# Patient Record
Sex: Male | Born: 1945 | Race: Black or African American | Hispanic: No | Marital: Married
Health system: Southern US, Academic
[De-identification: ages and names within clinical notes are randomized; demographics above are authoritative.]

## PROBLEM LIST (undated history)

## (undated) ENCOUNTER — Encounter: Attending: Hematology & Oncology | Primary: Hematology & Oncology

## (undated) ENCOUNTER — Telehealth

## (undated) ENCOUNTER — Encounter

## (undated) ENCOUNTER — Encounter
Attending: Student in an Organized Health Care Education/Training Program | Primary: Student in an Organized Health Care Education/Training Program

## (undated) ENCOUNTER — Telehealth: Attending: Hematology & Oncology | Primary: Hematology & Oncology

## (undated) ENCOUNTER — Ambulatory Visit

## (undated) ENCOUNTER — Telehealth
Attending: Student in an Organized Health Care Education/Training Program | Primary: Student in an Organized Health Care Education/Training Program

## (undated) ENCOUNTER — Ambulatory Visit: Payer: MEDICARE

## (undated) ENCOUNTER — Ambulatory Visit
Payer: MEDICARE | Attending: Student in an Organized Health Care Education/Training Program | Primary: Student in an Organized Health Care Education/Training Program

## (undated) ENCOUNTER — Ambulatory Visit
Payer: MEDICARE | Attending: Pharmacist Clinician (PhC)/ Clinical Pharmacy Specialist | Primary: Pharmacist Clinician (PhC)/ Clinical Pharmacy Specialist

## (undated) ENCOUNTER — Encounter: Attending: Vascular & Interventional Radiology | Primary: Vascular & Interventional Radiology

## (undated) ENCOUNTER — Ambulatory Visit: Payer: BLUE CROSS/BLUE SHIELD

## (undated) ENCOUNTER — Ambulatory Visit
Attending: Student in an Organized Health Care Education/Training Program | Primary: Student in an Organized Health Care Education/Training Program

## (undated) ENCOUNTER — Ambulatory Visit: Payer: MEDICARE | Attending: Hematology & Oncology | Primary: Hematology & Oncology

## (undated) ENCOUNTER — Encounter
Attending: Pharmacist Clinician (PhC)/ Clinical Pharmacy Specialist | Primary: Pharmacist Clinician (PhC)/ Clinical Pharmacy Specialist

## (undated) ENCOUNTER — Inpatient Hospital Stay

## (undated) ENCOUNTER — Encounter: Attending: Family | Primary: Family

## (undated) ENCOUNTER — Encounter: Attending: Oncology | Primary: Oncology

## (undated) DIAGNOSIS — C801 Malignant (primary) neoplasm, unspecified: Secondary | ICD-10-CM

---

## 2011-01-29 ENCOUNTER — Emergency Department: Payer: Self-pay | Admitting: Emergency Medicine

## 2011-11-25 IMAGING — CT CT HEAD WITHOUT CONTRAST
2 series · 16 of 30 positions shown, 20 images · non-contrast
Comparison: none

RESULT:      Technique: Helical 5mm sections were obtained from the skull
base to the vertex without administration of intravenous contrast.

[Series 2: without · axial · non-contrast · 0.50mm/px · z∈[-112,+18]mm · 13 of 32 slices shown, 17 images]
[im 3/32  brain]
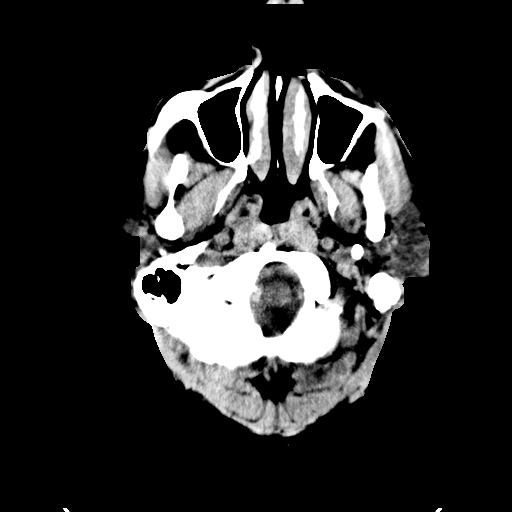
[im 3/32  bone]
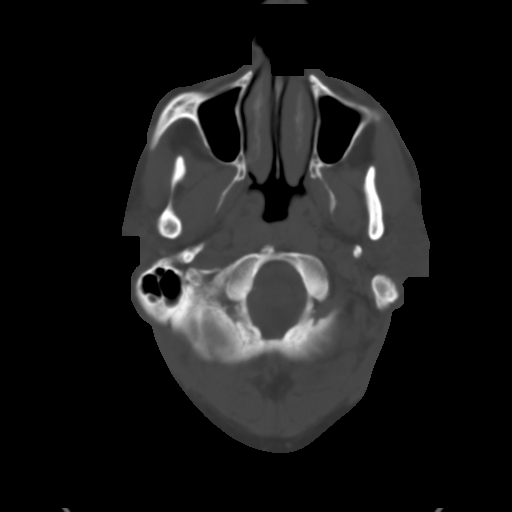
[im 5/32  brain]
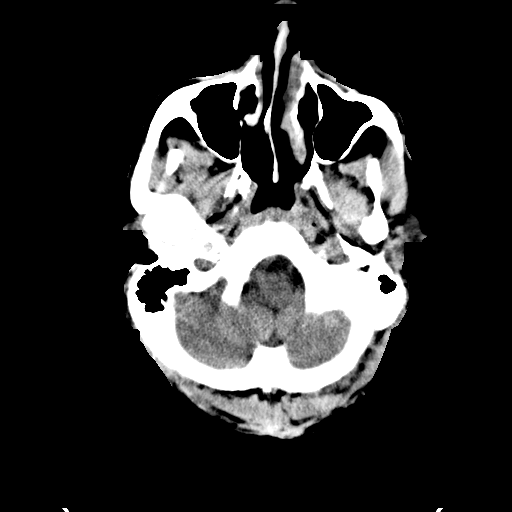
[im 7/32  brain]
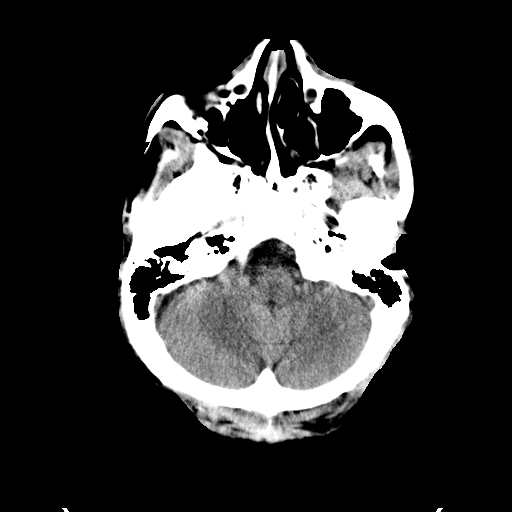
[im 9/32  brain]
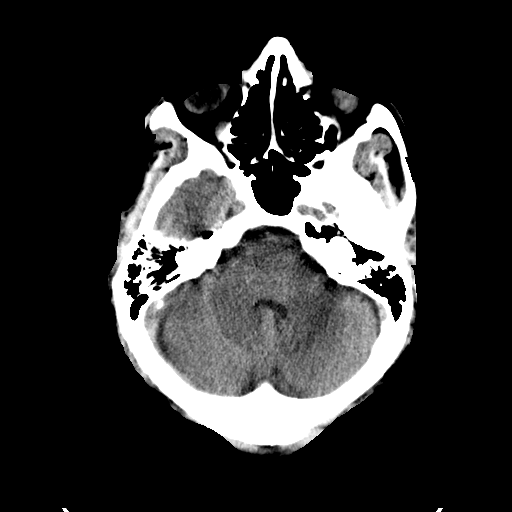
[im 12/32  brain]
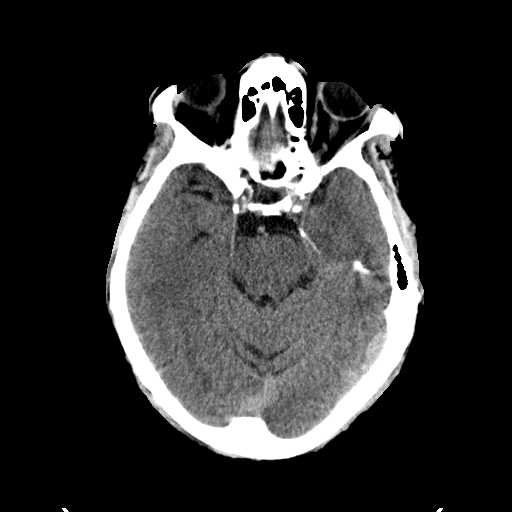
[im 12/32  bone]
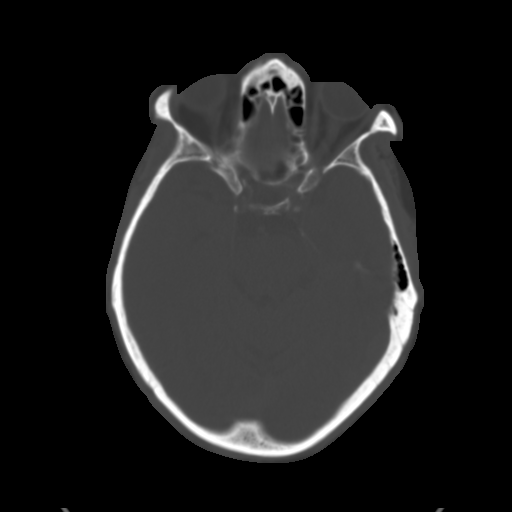
[im 14/32  brain]
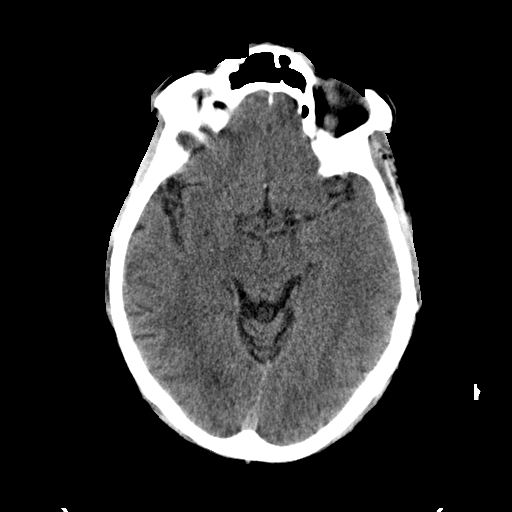
[im 16/32  brain]
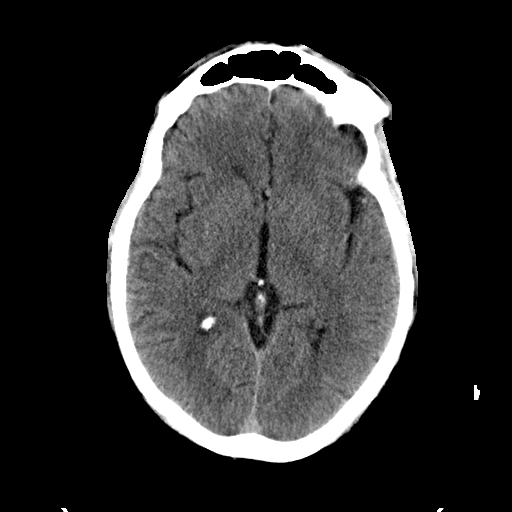
[im 18/32  brain]
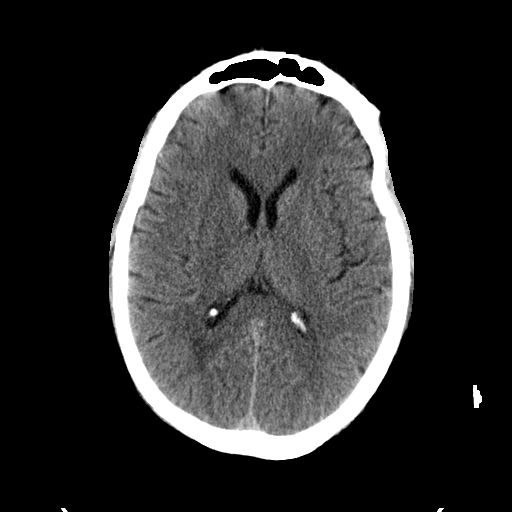
[im 20/32  brain]
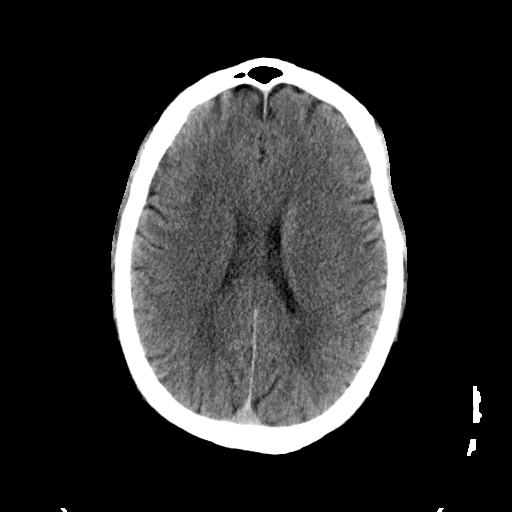
[im 20/32  bone]
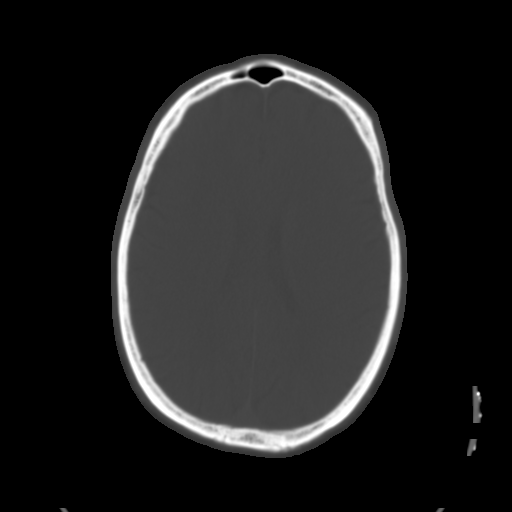
[im 23/32  brain]
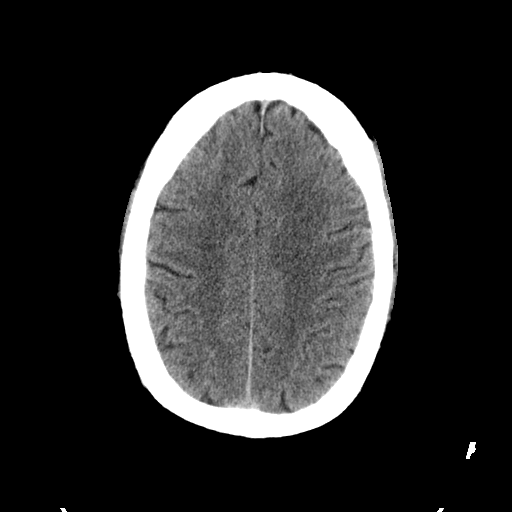
[im 25/32  brain]
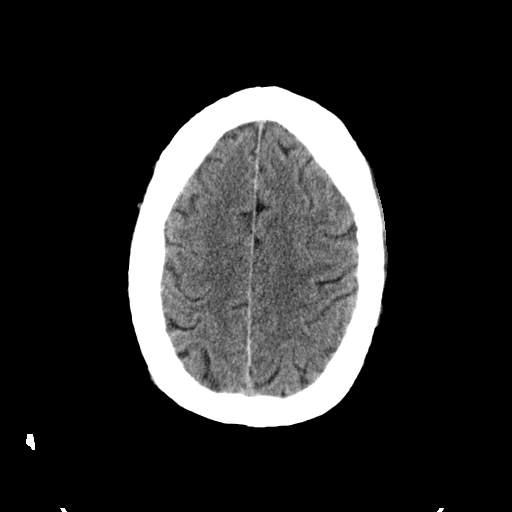
[im 27/32  brain]
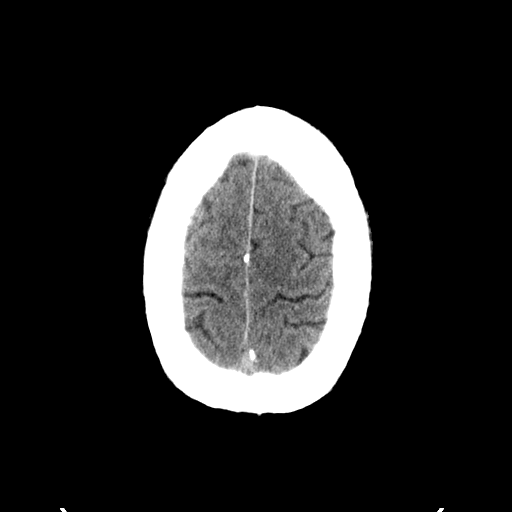
[im 29/32  brain]
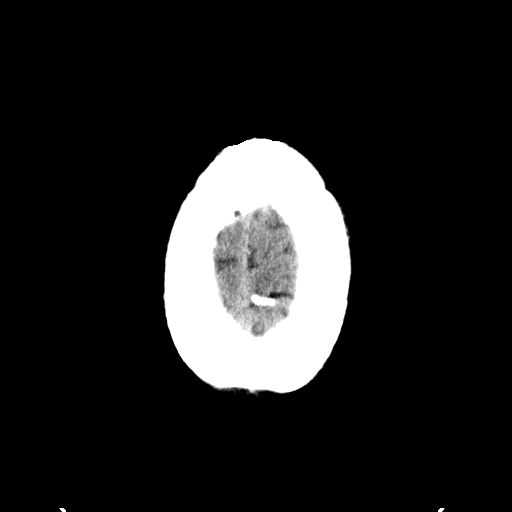
[im 29/32  bone]
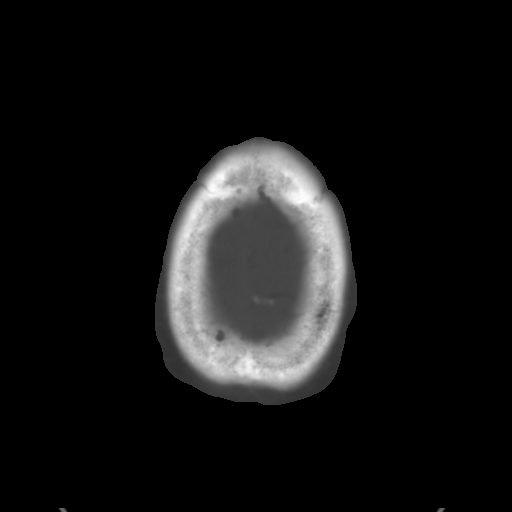

[Series 3: bone · axial · 0.50mm/px · z∈[-112,-66]mm · 3 of 32 slices shown]
[im 3/32  bone]
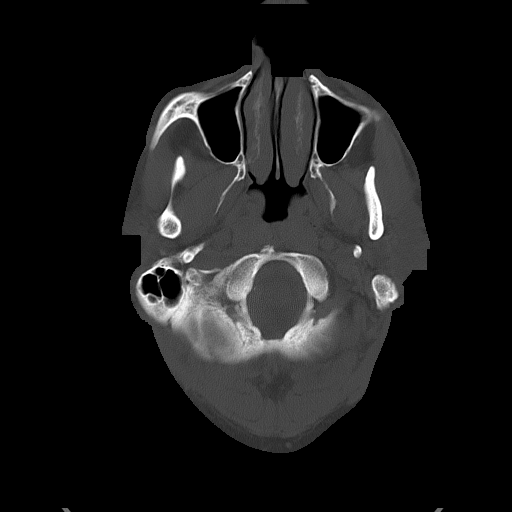
[im 7/32  bone]
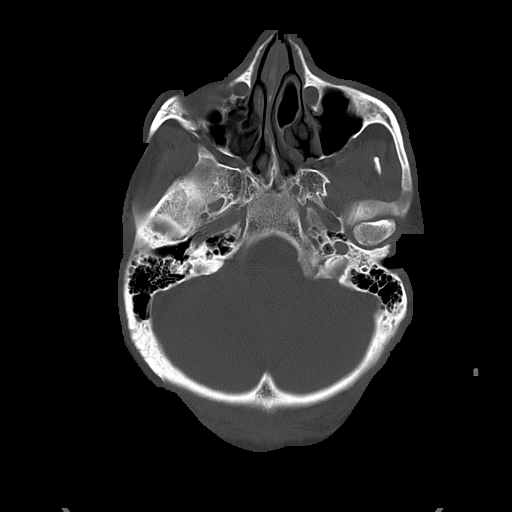
[im 12/32  bone]
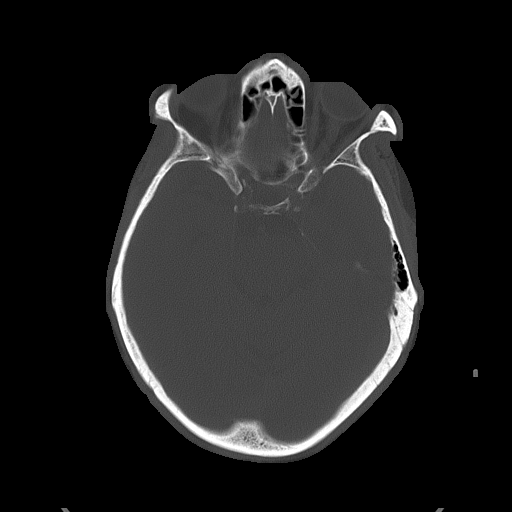

[16 of 30 positions shown; findings below may reference images not displayed]

FINDINGS: There is not evidence of intra-axial fluid collections. There is
no evidence of acute hemorrhage or secondary signs reflecting mass effect or
subacute or chronic focal territorial infarction. The osseous structures
demonstrate no evidence of a depressed skull fracture. If there is
persistent concern clinical follow-up with MRI is recommended.
IMPRESSION: 1. No evidence of acute intracranial abnormalitites.
2. Dr. Roberto of the emergency department was informed of these findings via a
preliminary faxed report.

## 2017-02-22 ENCOUNTER — Ambulatory Visit: Payer: Medicare Other

## 2017-02-22 ENCOUNTER — Ambulatory Visit
Admission: EM | Admit: 2017-02-22 | Discharge: 2017-02-22 | Disposition: A | Discharge status: Home / Self Care | Payer: Medicare Other | Attending: Family Medicine | Admitting: Family Medicine

## 2017-02-22 DIAGNOSIS — M25441 Effusion, right hand: Secondary | ICD-10-CM | POA: Diagnosis not present

## 2017-02-22 DIAGNOSIS — R2231 Localized swelling, mass and lump, right upper limb: Secondary | ICD-10-CM

## 2017-02-22 DIAGNOSIS — M79641 Pain in right hand: Secondary | ICD-10-CM | POA: Diagnosis present

## 2017-02-22 DIAGNOSIS — M19041 Primary osteoarthritis, right hand: Secondary | ICD-10-CM | POA: Insufficient documentation

## 2017-02-22 DIAGNOSIS — Z87891 Personal history of nicotine dependence: Secondary | ICD-10-CM | POA: Insufficient documentation

## 2017-02-22 DIAGNOSIS — Z79899 Other long term (current) drug therapy: Secondary | ICD-10-CM | POA: Insufficient documentation

## 2017-02-22 DIAGNOSIS — M7989 Other specified soft tissue disorders: Secondary | ICD-10-CM | POA: Diagnosis not present

## 2017-02-22 MED ORDER — MELOXICAM 15 MG PO TABS: 15.0000 mg | ORAL_TABLET | Freq: Every day | ORAL | 0 refills | Status: AC

## 2017-02-22 NOTE — ED Triage Notes (Addendum)
Pt reports 2 days of right hand pain and swelling. No known injury but reports it started when his right 5th finger "locked up."  Pain now radiates into right wrist. Pain level 10/10 when trying to move

## 2017-02-22 NOTE — ED Provider Notes (Signed)
[facility]    CSN: [id number] Arrival date & time: 02/22/17  0940     History   Chief Complaint Chief Complaint  Patient presents with  . Hand Problem    HPI Tracy Galloway is a 71 y.o. male.   Patient is a 71 year old black male who is complaining of pain in his right hand. Apparently she is a 74 felt that he may have slept on his right hand rolled over on. Next day he's had pain in the right hand difficulty making a fist moving the hand. Axis and the swelling has gone down he states from yesterday but still very tender to touch and still very painful hurts when he moves his hand or makes any changes in the hand and the fist of the right hand. He is concerned about the right finger jumping as he puts it most the swelling is more the ulnar side than the radial side of his hand and fingers. Denies any history of surgeries on the right hand or other medical problems like hand previously. He does not smoke. Denies any surgical history no previous medical problems. There is no pertinent family medical history relevant to today's visit either.   The history is provided by the patient. No language interpreter was used.  Hand Pain  This is a new problem. The current episode started 2 days ago. The problem occurs constantly. The problem has been gradually improving. Pertinent negatives include no chest pain, no abdominal pain, no headaches and no shortness of breath. The symptoms are aggravated by exertion. Nothing relieves the symptoms. He has tried acetaminophen for the symptoms. The treatment provided no relief.    History reviewed. No pertinent past medical history.  There are no active problems to display for this patient.   History reviewed. No pertinent surgical history.     Home Medications    Prior to Admission medications   Medication Sig Start Date End Date Taking? Authorizing Provider  meloxicam (MOBIC) 15 MG tablet Take 1 tablet (15 mg total) by mouth daily.  02/22/17   Frederich Cha, MD    Family History History reviewed. No pertinent family history.  Social History Social History  Substance Use Topics  . Smoking status: Former Research scientist (life sciences)  . Smokeless tobacco: Never Used  . Alcohol use No     Allergies   Patient has no known allergies.   Review of Systems Review of Systems  Respiratory: Negative for shortness of breath.   Cardiovascular: Negative for chest pain.  Gastrointestinal: Negative for abdominal pain.  Musculoskeletal: Positive for joint swelling and myalgias.  Neurological: Negative for headaches.  All other systems reviewed and are negative.    Physical Exam Triage Vital Signs ED Triage Vitals  Enc Vitals Group     BP 02/22/17 0957 (!) 174/102     Pulse Rate 02/22/17 0957 100     Resp 02/22/17 0957 20     Temp 02/22/17 0957 97.6 F (36.4 C)     Temp Source 02/22/17 0957 Oral     SpO2 02/22/17 0957 100 %     Weight 02/22/17 0959 250 lb (113.4 kg)     Height 02/22/17 0959 6\' 3"  (1.905 m)     Head Circumference --      Peak Flow --      Pain Score 02/22/17 0959 10     Pain Loc --      Pain Edu? --      Excl. in [place]? --  No data found.   Updated Vital Signs BP (!) 174/102 (BP Location: Left Arm)   Pulse 100   Temp 97.6 F (36.4 C) (Oral)   Resp 20   Ht 6\' 3"  (1.905 m)   Wt 250 lb (113.4 kg)   SpO2 100%   BMI 31.25 kg/m   Visual Acuity Right Eye Distance:   Left Eye Distance:   Bilateral Distance:    Right Eye Near:   Left Eye Near:    Bilateral Near:     Physical Exam  Constitutional: He is oriented to person, place, and time. He appears well-developed and well-nourished.  HENT:  Head: Normocephalic.  Eyes: Pupils are equal, round, and reactive to light.  Neck: Normal range of motion.  Pulmonary/Chest: Effort normal.  Musculoskeletal: He exhibits edema and tenderness.       Right hand: He exhibits decreased range of motion, tenderness, bony tenderness and swelling.       Hands: The  whole right hand is swollen in attempt to figure out exactly if there is any particular bony abnormalities or fractures of any of the bones of the hand patient when hand was touched the meal he withdrew it and screamed out don't touch my hand. At that point no further examination the hand will be attempted and will send him for x-rays right hand  Neurological: He is alert and oriented to person, place, and time. No cranial nerve deficit.  Skin: Skin is warm. He is not diaphoretic. No erythema.  Psychiatric: He has a normal mood and affect.  Vitals reviewed.    UC Treatments / Results  Labs (all labs ordered are listed, but only abnormal results are displayed) Labs Reviewed - No data to display  EKG  EKG Interpretation None       Radiology No results found.  Procedures Procedures (including critical care time)  Medications Ordered in UC Medications - No data to display   Initial Impression / Assessment and Plan / UC Course  I have reviewed the triage vital signs and the nursing notes.  Pertinent labs & imaging results that were available during my care of the patient were reviewed by me and considered in my medical decision making (see chart for details).     Right hand swelling and tenderness more than likely patient stepped on it with now bruising of the right hand and swelling of the right hand if x-rays of the hand is negative will place him on Mobic 15 mg 1 tablet a day and follow-up with his PCP or orthopedist of his choice not better in 1-2 weeks. But at this point time no further examination of the hand will be done per his request  Final Clinical Impressions(s) / UC Diagnoses   Final diagnoses:  Right hand pain  Swelling of right hand    New Prescriptions New Prescriptions   MELOXICAM (MOBIC) 15 MG TABLET    Take 1 tablet (15 mg total) by mouth daily.  X-ray of the right hand just showed arthritis present.   Note: This dictation was prepared with Dragon  dictation along with smaller phrase technology. Any transcriptional errors that result from this process are unintentional.   Frederich Cha, MD 02/22/17 1113

## 2017-12-19 IMAGING — CR DG HAND COMPLETE 3+V*R*
3 series · 4 of 4 positions shown · non-contrast
Comparison: None in PACs

CLINICAL DATA: Two days of fifth finger pain radiating into the
medial aspect of the hand. Occasional locking of the fifth finger.
Touch tender.

EXAM:
RIGHT HAND - COMPLETE 3+ VIEW

[hand ap]
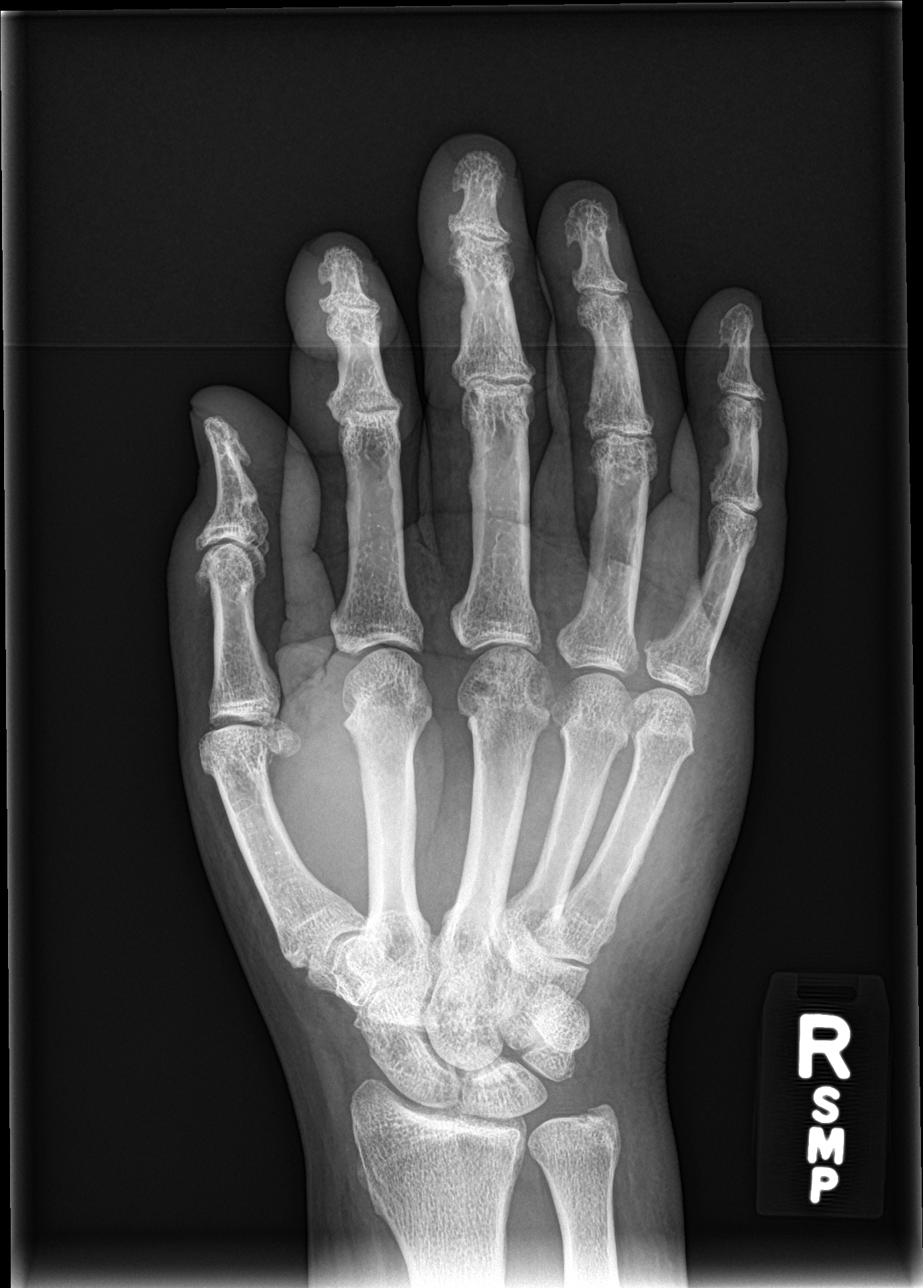

[hand obl]
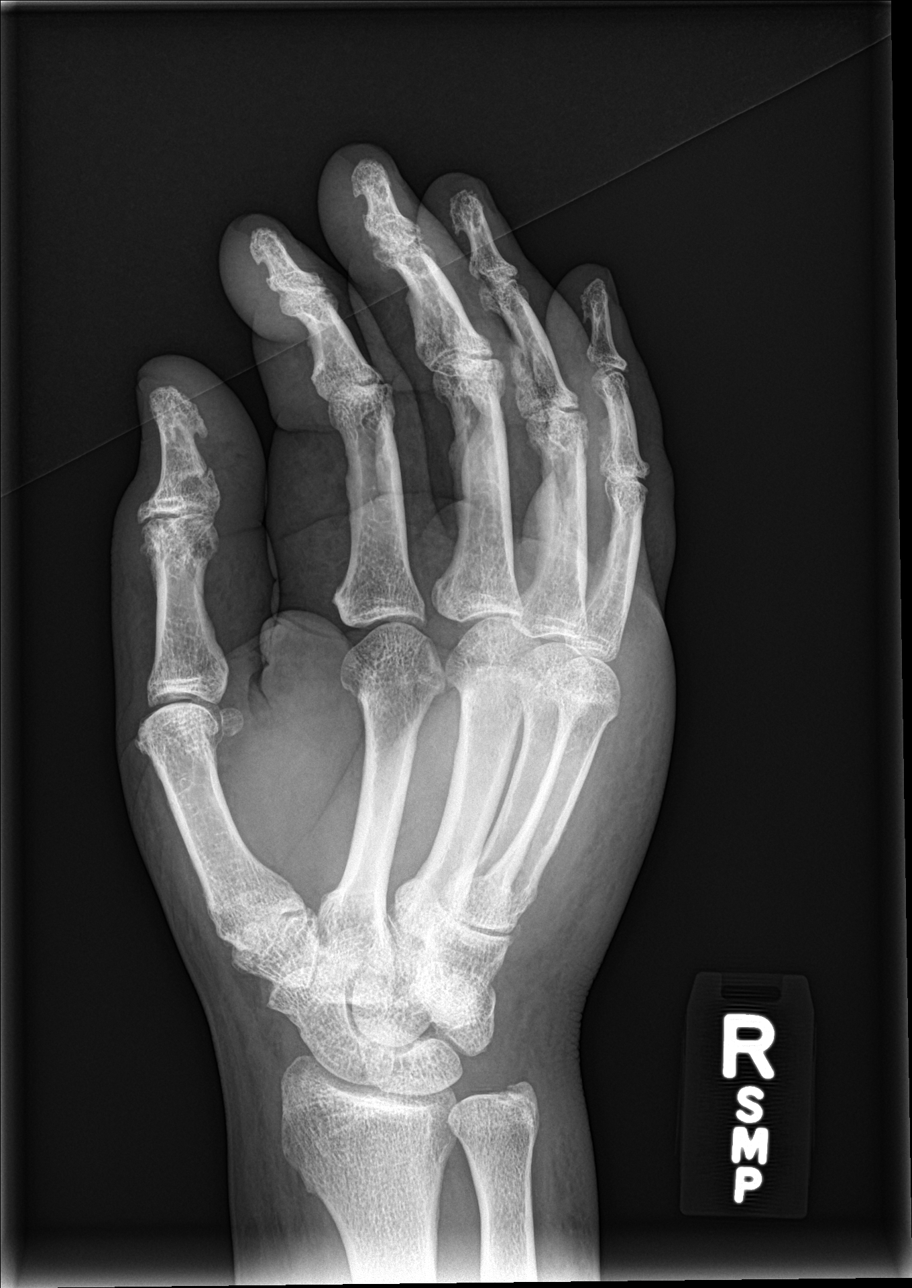

[Series 3: hand lat · 0.14mm/px · 2 of 2 slices shown]
[im 1/2]
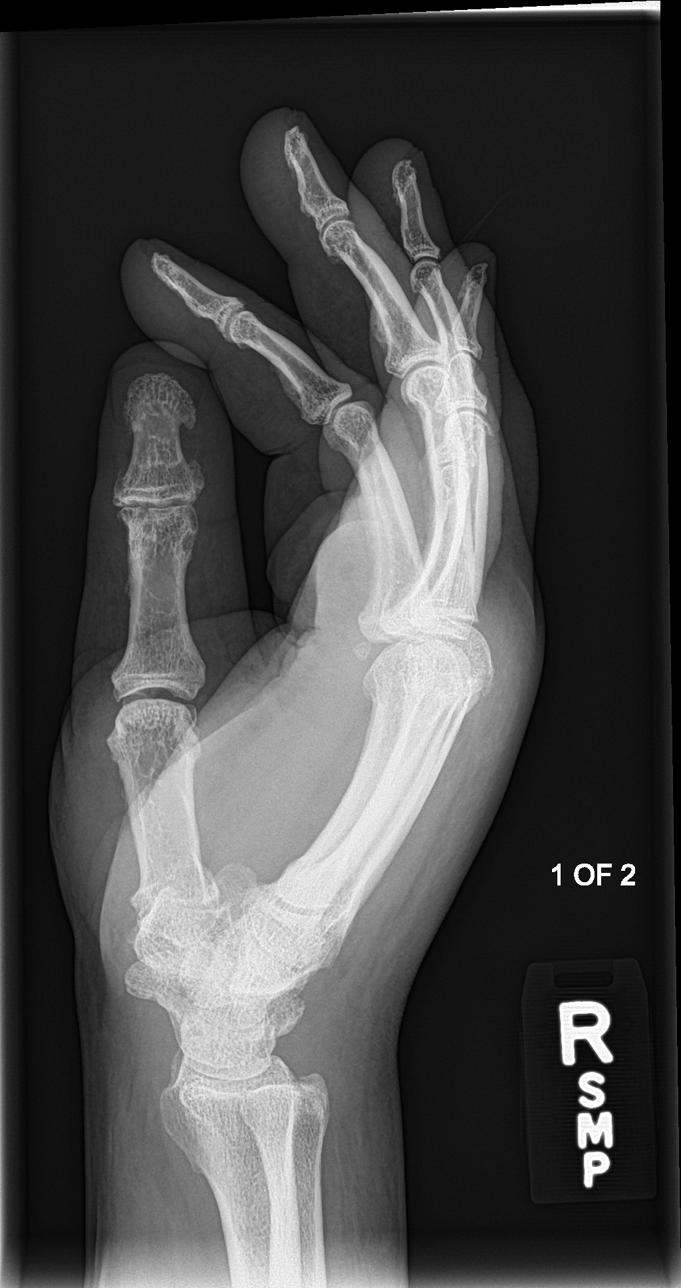
[im 2/2]
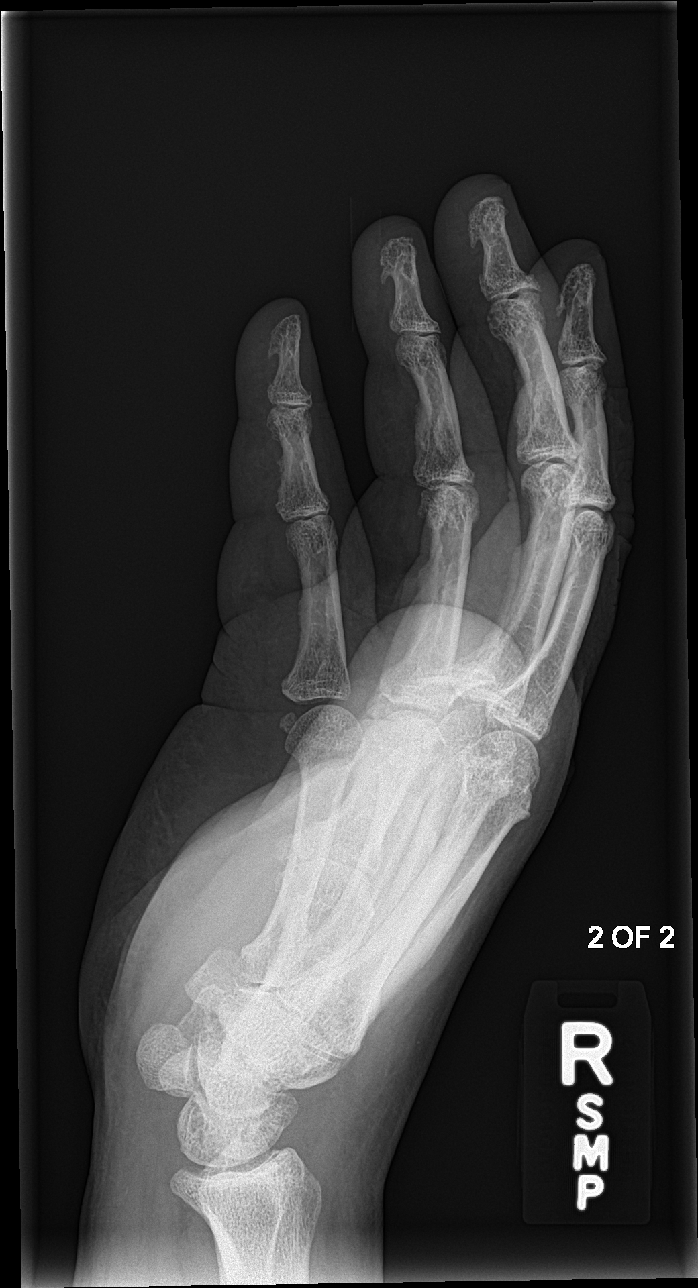

[4 of 4 positions shown; findings below may reference images not displayed]

FINDINGS: The bones are subjectively adequately mineralized. There is mild
degenerative change of the interphalangeal joints diffusely. There
is mild narrowing of the first, second, and third MCP joints. No
erosive changes are observed. There is no acute fracture or
dislocation. There is no lytic nor blastic lesion. The carpal bones
exhibit no acute abnormalities.
IMPRESSION: Mild osteoarthritic change of the interphalangeal and MCP joints as
described. No acute abnormality of the fifth finger is observed.

## 2018-10-15 ENCOUNTER — Ambulatory Visit: Admit: 2018-10-15 | Discharge: 2018-10-17 | Disposition: A | Discharge status: Home Health | Payer: MEDICARE

## 2018-10-15 DIAGNOSIS — Z87891 Personal history of nicotine dependence: Principal | ICD-10-CM

## 2018-10-15 DIAGNOSIS — G9389 Other specified disorders of brain: Principal | ICD-10-CM

## 2018-10-15 DIAGNOSIS — I10 Essential (primary) hypertension: Principal | ICD-10-CM

## 2018-10-15 DIAGNOSIS — R42 Dizziness and giddiness: Principal | ICD-10-CM

## 2018-10-15 DIAGNOSIS — I161 Hypertensive emergency: Principal | ICD-10-CM

## 2018-10-15 DIAGNOSIS — C7951 Secondary malignant neoplasm of bone: Principal | ICD-10-CM

## 2018-10-16 DIAGNOSIS — I10 Essential (primary) hypertension: Principal | ICD-10-CM

## 2018-10-16 DIAGNOSIS — G9389 Other specified disorders of brain: Principal | ICD-10-CM

## 2018-10-16 DIAGNOSIS — I161 Hypertensive emergency: Principal | ICD-10-CM

## 2018-10-16 DIAGNOSIS — R42 Dizziness and giddiness: Principal | ICD-10-CM

## 2018-10-16 DIAGNOSIS — Z87891 Personal history of nicotine dependence: Principal | ICD-10-CM

## 2018-10-16 DIAGNOSIS — C7951 Secondary malignant neoplasm of bone: Principal | ICD-10-CM

## 2018-10-16 MED ORDER — ONDANSETRON HCL 4 MG TABLET
ORAL_TABLET | Freq: Three times a day (TID) | ORAL | 0 refills | 0 days | Status: CP | PRN
Start: 2018-10-16 — End: 2018-10-17

## 2018-10-16 MED ORDER — LISINOPRIL 40 MG TABLET: 40 mg | tablet | Freq: Every day | 0 refills | 0 days | Status: AC

## 2018-10-17 DIAGNOSIS — R42 Dizziness and giddiness: Principal | ICD-10-CM

## 2018-10-17 DIAGNOSIS — I161 Hypertensive emergency: Principal | ICD-10-CM

## 2018-10-17 DIAGNOSIS — C7951 Secondary malignant neoplasm of bone: Principal | ICD-10-CM

## 2018-10-17 DIAGNOSIS — G9389 Other specified disorders of brain: Principal | ICD-10-CM

## 2018-10-17 DIAGNOSIS — Z87891 Personal history of nicotine dependence: Principal | ICD-10-CM

## 2018-10-17 DIAGNOSIS — I10 Essential (primary) hypertension: Principal | ICD-10-CM

## 2018-10-17 MED ORDER — LISINOPRIL 40 MG TABLET
ORAL_TABLET | Freq: Every day | ORAL | 0 refills | 0.00000 days | Status: CP
Start: 2018-10-17 — End: 2018-10-17
  Filled 2018-10-17: qty 30, 30d supply, fill #0

## 2018-10-17 MED ORDER — LISINOPRIL 40 MG TABLET: 40 mg | tablet | Freq: Every day | 0 refills | 0 days | Status: AC

## 2018-10-17 MED FILL — LISINOPRIL 40 MG TABLET: 30 days supply | Qty: 30 | Fill #0 | Status: AC

## 2018-10-21 ENCOUNTER — Institutional Professional Consult (permissible substitution): Admit: 2018-10-21 | Discharge: 2018-10-22 | Payer: MEDICARE

## 2018-10-21 DIAGNOSIS — G9389 Other specified disorders of brain: Principal | ICD-10-CM

## 2018-10-23 ENCOUNTER — Ambulatory Visit
Admit: 2018-10-23 | Discharge: 2018-10-24 | Payer: MEDICARE | Attending: Hematology & Oncology | Primary: Hematology & Oncology

## 2018-10-23 DIAGNOSIS — C61 Malignant neoplasm of prostate: Principal | ICD-10-CM

## 2018-10-23 DIAGNOSIS — C7951 Secondary malignant neoplasm of bone: Principal | ICD-10-CM

## 2018-10-23 DIAGNOSIS — I10 Essential (primary) hypertension: Principal | ICD-10-CM

## 2018-10-23 MED ORDER — APALUTAMIDE 60 MG TABLET
ORAL_TABLET | Freq: Every day | ORAL | 11 refills | 0 days | Status: CP
Start: 2018-10-23 — End: 2019-01-02

## 2018-10-25 ENCOUNTER — Ambulatory Visit: Admit: 2018-10-25 | Discharge: 2018-10-26 | Payer: MEDICARE

## 2018-10-25 DIAGNOSIS — I10 Essential (primary) hypertension: Principal | ICD-10-CM

## 2018-10-25 DIAGNOSIS — C61 Malignant neoplasm of prostate: Principal | ICD-10-CM

## 2018-10-25 DIAGNOSIS — C801 Malignant (primary) neoplasm, unspecified: Principal | ICD-10-CM

## 2018-10-25 DIAGNOSIS — R739 Hyperglycemia, unspecified: Principal | ICD-10-CM

## 2018-10-25 DIAGNOSIS — D638 Anemia in other chronic diseases classified elsewhere: Principal | ICD-10-CM

## 2018-10-25 DIAGNOSIS — I161 Hypertensive emergency: Principal | ICD-10-CM

## 2018-10-25 DIAGNOSIS — Z09 Encounter for follow-up examination after completed treatment for conditions other than malignant neoplasm: Principal | ICD-10-CM

## 2018-10-28 ENCOUNTER — Ambulatory Visit: Admit: 2018-10-28 | Discharge: 2018-11-10 | Payer: MEDICARE

## 2018-10-28 DIAGNOSIS — C61 Malignant neoplasm of prostate: Principal | ICD-10-CM

## 2018-10-28 DIAGNOSIS — R937 Abnormal findings on diagnostic imaging of other parts of musculoskeletal system: Principal | ICD-10-CM

## 2018-10-30 ENCOUNTER — Ambulatory Visit
Admit: 2018-10-30 | Discharge: 2018-10-30 | Payer: MEDICARE | Attending: Hematology & Oncology | Primary: Hematology & Oncology

## 2018-10-30 ENCOUNTER — Institutional Professional Consult (permissible substitution): Admit: 2018-10-30 | Discharge: 2018-10-30 | Payer: MEDICARE

## 2018-10-30 ENCOUNTER — Ambulatory Visit: Admit: 2018-10-30 | Discharge: 2018-10-30 | Payer: MEDICARE

## 2018-10-30 DIAGNOSIS — C61 Malignant neoplasm of prostate: Principal | ICD-10-CM

## 2018-10-30 DIAGNOSIS — C7951 Secondary malignant neoplasm of bone: Secondary | ICD-10-CM

## 2018-10-30 MED ORDER — UREA 10 % TOPICAL CREAM
Freq: Three times a day (TID) | TOPICAL | 11 refills | 0.00000 days | Status: CP
Start: 2018-10-30 — End: 2019-02-05

## 2018-10-30 MED ORDER — TRAMADOL 50 MG TABLET
ORAL_TABLET | 0 refills | 0 days | Status: CP
Start: 2018-10-30 — End: 2018-12-02
  Filled 2018-10-30: qty 60, 5d supply, fill #0

## 2018-10-30 MED ORDER — CELECOXIB 200 MG CAPSULE
ORAL_CAPSULE | Freq: Two times a day (BID) | ORAL | 11 refills | 0 days | Status: CP | PRN
Start: 2018-10-30 — End: 2019-02-11
  Filled 2018-10-30: qty 28, 14d supply, fill #0

## 2018-10-30 MED ORDER — MELOXICAM 7.5 MG TABLET
ORAL_TABLET | Freq: Two times a day (BID) | ORAL | 2 refills | 0.00000 days | Status: SS | PRN
Start: 2018-10-30 — End: 2018-10-30

## 2018-10-30 MED FILL — CELEBREX 200 MG CAPSULE: 14 days supply | Qty: 28 | Fill #0 | Status: AC

## 2018-10-30 MED FILL — TRAMADOL 50 MG TABLET: 5 days supply | Qty: 60 | Fill #0 | Status: AC

## 2018-12-02 ENCOUNTER — Ambulatory Visit
Admit: 2018-12-02 | Discharge: 2018-12-03 | Payer: MEDICARE | Attending: Hematology & Oncology | Primary: Hematology & Oncology

## 2018-12-02 DIAGNOSIS — C7951 Secondary malignant neoplasm of bone: Secondary | ICD-10-CM

## 2018-12-02 DIAGNOSIS — R799 Abnormal finding of blood chemistry, unspecified: Secondary | ICD-10-CM

## 2018-12-02 DIAGNOSIS — C61 Malignant neoplasm of prostate: Principal | ICD-10-CM

## 2018-12-02 DIAGNOSIS — E032 Hypothyroidism due to medicaments and other exogenous substances: Secondary | ICD-10-CM

## 2018-12-02 MED ORDER — CALCIUM CARBONATE 500 MG(1,250 MG)-VITAMIN D3 400 UNIT CHEWABLE TABLET: 1 | tablet | Freq: Every day | 11 refills | 0 days | Status: SS

## 2018-12-02 MED ORDER — CALCIUM CARBONATE 500 MG(1,250 MG)-VITAMIN D3 400 UNIT CHEWABLE TABLET
ORAL_TABLET | Freq: Every day | ORAL | 11 refills | 0.00000 days | Status: SS
Start: 2018-12-02 — End: 2019-02-03

## 2018-12-02 MED ORDER — TRAMADOL 50 MG TABLET
ORAL_TABLET | 0 refills | 0 days | Status: CP
Start: 2018-12-02 — End: 2019-02-05
  Filled 2018-12-11: qty 60, 8d supply, fill #0

## 2018-12-11 MED FILL — CELEBREX 200 MG CAPSULE: 30 days supply | Qty: 60 | Fill #1 | Status: AC

## 2018-12-11 MED FILL — TRAMADOL 50 MG TABLET: 8 days supply | Qty: 60 | Fill #0 | Status: AC

## 2018-12-11 MED FILL — CELEBREX 200 MG CAPSULE: ORAL | 30 days supply | Qty: 60 | Fill #1

## 2019-01-02 ENCOUNTER — Institutional Professional Consult (permissible substitution): Admit: 2019-01-02 | Discharge: 2019-01-02 | Payer: MEDICARE

## 2019-01-02 ENCOUNTER — Ambulatory Visit: Admit: 2019-01-02 | Discharge: 2019-01-02 | Payer: MEDICARE

## 2019-01-02 ENCOUNTER — Ambulatory Visit
Admit: 2019-01-02 | Discharge: 2019-01-02 | Payer: MEDICARE | Attending: Hematology & Oncology | Primary: Hematology & Oncology

## 2019-01-02 DIAGNOSIS — C7951 Secondary malignant neoplasm of bone: Secondary | ICD-10-CM

## 2019-01-02 DIAGNOSIS — R799 Abnormal finding of blood chemistry, unspecified: Secondary | ICD-10-CM

## 2019-01-02 DIAGNOSIS — C61 Malignant neoplasm of prostate: Principal | ICD-10-CM

## 2019-01-02 DIAGNOSIS — E032 Hypothyroidism due to medicaments and other exogenous substances: Secondary | ICD-10-CM

## 2019-01-02 MED ORDER — LISINOPRIL 20 MG TABLET: 20 mg | tablet | Freq: Every day | 11 refills | 0 days | Status: AC

## 2019-01-02 MED ORDER — APALUTAMIDE 60 MG TABLET
ORAL_TABLET | Freq: Every day | ORAL | 11 refills | 0.00000 days | Status: CP
Start: 2019-01-02 — End: 2019-02-03

## 2019-01-02 MED ORDER — LISINOPRIL 20 MG TABLET
ORAL_TABLET | Freq: Every day | ORAL | 11 refills | 0.00000 days | Status: CP
Start: 2019-01-02 — End: 2019-02-03

## 2019-01-06 ENCOUNTER — Ambulatory Visit: Admit: 2019-01-06 | Discharge: 2019-01-07 | Payer: MEDICARE

## 2019-01-06 DIAGNOSIS — R7989 Other specified abnormal findings of blood chemistry: Secondary | ICD-10-CM

## 2019-01-06 DIAGNOSIS — I10 Essential (primary) hypertension: Principal | ICD-10-CM

## 2019-01-06 DIAGNOSIS — C61 Malignant neoplasm of prostate: Secondary | ICD-10-CM

## 2019-01-06 DIAGNOSIS — E785 Hyperlipidemia, unspecified: Secondary | ICD-10-CM

## 2019-01-31 ENCOUNTER — Ambulatory Visit
Admit: 2019-01-31 | Discharge: 2019-02-03 | Disposition: A | Discharge status: Home / Self Care | Payer: MEDICARE | Source: Ambulatory Visit | Admitting: Internal Medicine

## 2019-01-31 ENCOUNTER — Ambulatory Visit
Admission: EM | Admit: 2019-01-31 | Discharge: 2019-01-31 | Disposition: A | Discharge status: Other Acute Inpatient Hospital | Payer: Medicare Other | Attending: Family Medicine | Admitting: Family Medicine

## 2019-01-31 ENCOUNTER — Other Ambulatory Visit: Payer: Self-pay

## 2019-01-31 ENCOUNTER — Encounter: Payer: Self-pay | Admitting: Emergency Medicine

## 2019-01-31 DIAGNOSIS — R112 Nausea with vomiting, unspecified: Secondary | ICD-10-CM | POA: Insufficient documentation

## 2019-01-31 DIAGNOSIS — E860 Dehydration: Secondary | ICD-10-CM | POA: Diagnosis present

## 2019-01-31 DIAGNOSIS — R197 Diarrhea, unspecified: Secondary | ICD-10-CM

## 2019-01-31 DIAGNOSIS — E875 Hyperkalemia: Secondary | ICD-10-CM | POA: Insufficient documentation

## 2019-01-31 DIAGNOSIS — I959 Hypotension, unspecified: Secondary | ICD-10-CM | POA: Insufficient documentation

## 2019-01-31 DIAGNOSIS — N189 Chronic kidney disease, unspecified: Secondary | ICD-10-CM | POA: Diagnosis present

## 2019-01-31 DIAGNOSIS — N179 Acute kidney failure, unspecified: Secondary | ICD-10-CM

## 2019-01-31 HISTORY — DX: Malignant (primary) neoplasm, unspecified: C80.1

## 2019-01-31 LAB — COMPREHENSIVE METABOLIC PANEL
ALT: 13 U/L (ref 0–44)
AST: 20 U/L (ref 15–41)
Albumin: 4 g/dL (ref 3.5–5.0)
Alkaline Phosphatase: 117 U/L (ref 38–126)
Anion gap: 12 (ref 5–15)
BUN: 54 mg/dL — ABNORMAL HIGH (ref 8–23)
CO2: 19 mmol/L — ABNORMAL LOW (ref 22–32)
Calcium: 7.3 mg/dL — ABNORMAL LOW (ref 8.9–10.3)
Chloride: 105 mmol/L (ref 98–111)
Creatinine, Ser: 2.54 mg/dL — ABNORMAL HIGH (ref 0.61–1.24)
GFR calc Af Amer: 29 mL/min — ABNORMAL LOW (ref >60)
GFR calc non Af Amer: 25 mL/min — ABNORMAL LOW (ref >60)
Glucose, Bld: 118 mg/dL — ABNORMAL HIGH (ref 70–99)
Potassium: 5.2 mmol/L — ABNORMAL HIGH (ref 3.5–5.1)
Sodium: 136 mmol/L (ref 135–145)
Total Bilirubin: 0.6 mg/dL (ref 0.3–1.2)
Total Protein: 7.9 g/dL (ref 6.5–8.1)

## 2019-01-31 NOTE — Discharge Instructions (Signed)
Recommend patient go to Emergency Department for further evaluation and management °

## 2019-01-31 NOTE — ED Provider Notes (Signed)
[facility]    CSN: [id number] Arrival date & time: 01/31/19  1344     History   Chief Complaint Chief Complaint  Patient presents with  . Nausea  . Emesis    HPI Tracy Galloway is a 73 y.o. male.   73 yo male with a c/o nausea, vomiting and diarrhea for the past week. States he took some Pepto Bismol and diarrhea resolved but still having vomiting. States he has not been able to keep fluids down. Denies any pain, melena, hematochezia, hematemesis.      Past Medical History:  Diagnosis Date  . Cancer ([place])     There are no active problems to display for this patient.   History reviewed. No pertinent surgical history.     Home Medications    Prior to Admission medications   Medication Sig Start Date End Date Taking? Authorizing Provider  apalutamide (ERLEADA) 60 MG tablet Take by mouth. 01/02/19 02/01/19 Yes [provider]  Calcium Carb-Cholecalciferol (CALCIUM + VITAMIN D3) 500-400 MG-UNIT CHEW Chew by mouth. 12/02/18 12/02/19 Yes [provider]  celecoxib (CELEBREX) 200 MG capsule Take by mouth. 10/30/18 10/30/19 Yes [provider]  lisinopril (ZESTRIL) 20 MG tablet Take by mouth. 01/02/19 01/02/20 Yes [provider]  traMADol (ULTRAM) 50 MG tablet Take 1 or 2 tablets every 4 to 6 hours as needed for pain 12/02/18  Yes [provider]  urea (CARMOL) 10 % cream Apply topically. 10/30/18 10/30/19 Yes [provider]  acetaminophen (TYLENOL) 500 MG tablet Take by mouth.    [provider]  meloxicam (MOBIC) 15 MG tablet Take 1 tablet (15 mg total) by mouth daily. 02/22/17   Frederich Cha, MD    Family History History reviewed. No pertinent family history.  Social History Social History   Tobacco Use  . Smoking status: Former Research scientist (life sciences)  . Smokeless tobacco: Never Used  Substance Use Topics  . Alcohol use: No  . Drug use: No     Allergies   Patient has no known allergies.   Review of Systems Review  of Systems   Physical Exam Triage Vital Signs ED Triage Vitals  Enc Vitals Group     BP 01/31/19 1404 (!) 89/61     Pulse Rate 01/31/19 1404 100     Resp 01/31/19 1404 16     Temp 01/31/19 1404 98 F (36.7 C)     Temp Source 01/31/19 1404 Oral     SpO2 01/31/19 1404 100 %     Weight 01/31/19 1401 246 lb (111.6 kg)     Height 01/31/19 1401 6\' 2"  (1.88 m)     Head Circumference --      Peak Flow --      Pain Score 01/31/19 1401 0     Pain Loc --      Pain Edu? --      Excl. in [place]? --    No data found.  Updated Vital Signs BP (!) 89/61 (BP Location: Left Arm)   Pulse 100   Temp 98 F (36.7 C) (Oral)   Resp 16   Ht 6\' 2"  (1.88 m)   Wt 111.6 kg   SpO2 100%   BMI 31.58 kg/m   Visual Acuity Right Eye Distance:   Left Eye Distance:   Bilateral Distance:    Right Eye Near:   Left Eye Near:    Bilateral Near:     Physical Exam Vitals signs and nursing note reviewed.  Constitutional:  General: He is not in acute distress.    Appearance: He is not toxic-appearing or diaphoretic.  Cardiovascular:     Rate and Rhythm: Tachycardia present.     Heart sounds: Normal heart sounds.  Pulmonary:     Effort: Pulmonary effort is normal. No respiratory distress.     Breath sounds: No stridor. No wheezing or rhonchi.  Abdominal:     General: Bowel sounds are normal. There is no distension.     Palpations: Abdomen is soft. There is no mass.     Tenderness: There is no abdominal tenderness. There is no right CVA tenderness, left CVA tenderness, guarding or rebound.     Hernia: No hernia is present.  Neurological:     Mental Status: He is alert.      UC Treatments / Results  Labs (all labs ordered are listed, but only abnormal results are displayed) Labs Reviewed  COMPREHENSIVE METABOLIC PANEL - Abnormal; Notable for the following components:      Result Value   Potassium 5.2 (*)    CO2 19 (*)    Glucose, Bld 118 (*)    BUN 54 (*)    Creatinine, Ser 2.54 (*)     Calcium 7.3 (*)    GFR calc non Af Amer 25 (*)    GFR calc Af Amer 29 (*)    All other components within normal limits    EKG   Radiology No results found.  Procedures Procedures (including critical care time)  Medications Ordered in UC Medications - No data to display  Initial Impression / Assessment and Plan / UC Course  I have reviewed the triage vital signs and the nursing notes.  Pertinent labs & imaging results that were available during my care of the patient were reviewed by me and considered in my medical decision making (see chart for details).      Final Clinical Impressions(s) / UC Diagnoses   Final diagnoses:  Dehydration  Hypotension, unspecified hypotension type  Nausea vomiting and diarrhea  Hyperkalemia  Acute renal failure superimposed on chronic kidney disease, unspecified CKD stage, unspecified acute renal failure type [facility])    ED Prescriptions    None      1. Lab results and diagnosis reviewed with patient; recommend patient go to Emergency Department for further evaluation and management   Controlled Substance Prescriptions [place] Controlled Substance Registry consulted? Not Applicable   Norval Gable, MD 01/31/19 [phone]

## 2019-01-31 NOTE — ED Triage Notes (Signed)
Patient c/o N/V that 3 days ago.  Patient reports diarrhea earlier but has resolved after taking Imodium.  Patient denies fevers.

## 2019-02-01 DIAGNOSIS — N179 Acute kidney failure, unspecified: Principal | ICD-10-CM

## 2019-02-05 ENCOUNTER — Ambulatory Visit: Admit: 2019-02-05 | Discharge: 2019-02-05 | Payer: MEDICARE

## 2019-02-05 ENCOUNTER — Ambulatory Visit
Admit: 2019-02-05 | Discharge: 2019-02-05 | Payer: MEDICARE | Attending: Hematology & Oncology | Primary: Hematology & Oncology

## 2019-02-05 DIAGNOSIS — N179 Acute kidney failure, unspecified: Secondary | ICD-10-CM

## 2019-02-05 DIAGNOSIS — C7951 Secondary malignant neoplasm of bone: Secondary | ICD-10-CM

## 2019-02-05 DIAGNOSIS — C61 Malignant neoplasm of prostate: Principal | ICD-10-CM

## 2019-02-05 DIAGNOSIS — D649 Anemia, unspecified: Secondary | ICD-10-CM

## 2019-02-05 MED ORDER — PROCHLORPERAZINE MALEATE 10 MG TABLET
ORAL_TABLET | Freq: Four times a day (QID) | ORAL | 2 refills | 0.00000 days | Status: CP | PRN
Start: 2019-02-05 — End: 2019-02-05
  Filled 2019-02-05: qty 30, 8d supply, fill #0

## 2019-02-05 MED ORDER — PROCHLORPERAZINE MALEATE 10 MG TABLET: 10 mg | tablet | Freq: Four times a day (QID) | 2 refills | 0 days | Status: AC

## 2019-02-05 MED FILL — PROCHLORPERAZINE MALEATE 10 MG TABLET: 8 days supply | Qty: 30 | Fill #0 | Status: AC

## 2019-02-11 ENCOUNTER — Ambulatory Visit: Admit: 2019-02-11 | Discharge: 2019-02-12 | Payer: MEDICARE

## 2019-02-11 DIAGNOSIS — R197 Diarrhea, unspecified: Secondary | ICD-10-CM

## 2019-02-11 DIAGNOSIS — R1110 Vomiting, unspecified: Secondary | ICD-10-CM

## 2019-02-11 DIAGNOSIS — C61 Malignant neoplasm of prostate: Secondary | ICD-10-CM

## 2019-02-11 DIAGNOSIS — N170 Acute kidney failure with tubular necrosis: Secondary | ICD-10-CM

## 2019-02-11 DIAGNOSIS — Z09 Encounter for follow-up examination after completed treatment for conditions other than malignant neoplasm: Principal | ICD-10-CM

## 2019-02-19 ENCOUNTER — Ambulatory Visit: Admit: 2019-02-19 | Discharge: 2019-02-20 | Payer: MEDICARE

## 2019-02-19 DIAGNOSIS — C7951 Secondary malignant neoplasm of bone: Secondary | ICD-10-CM

## 2019-02-19 DIAGNOSIS — D649 Anemia, unspecified: Secondary | ICD-10-CM

## 2019-02-19 DIAGNOSIS — C61 Malignant neoplasm of prostate: Principal | ICD-10-CM

## 2019-03-04 ENCOUNTER — Ambulatory Visit: Admit: 2019-03-04 | Discharge: 2019-03-05 | Payer: MEDICARE

## 2019-03-04 ENCOUNTER — Institutional Professional Consult (permissible substitution): Admit: 2019-03-04 | Discharge: 2019-03-05 | Payer: MEDICARE

## 2019-03-04 ENCOUNTER — Ambulatory Visit
Admit: 2019-03-04 | Discharge: 2019-03-05 | Payer: MEDICARE | Attending: Hematology & Oncology | Primary: Hematology & Oncology

## 2019-03-04 DIAGNOSIS — C7951 Secondary malignant neoplasm of bone: Principal | ICD-10-CM

## 2019-03-04 DIAGNOSIS — D649 Anemia, unspecified: Secondary | ICD-10-CM

## 2019-03-04 DIAGNOSIS — C61 Malignant neoplasm of prostate: Secondary | ICD-10-CM

## 2019-03-04 DIAGNOSIS — N179 Acute kidney failure, unspecified: Secondary | ICD-10-CM

## 2019-04-08 ENCOUNTER — Ambulatory Visit: Admit: 2019-04-08 | Discharge: 2019-04-09 | Payer: MEDICARE

## 2019-04-08 DIAGNOSIS — C61 Malignant neoplasm of prostate: Secondary | ICD-10-CM

## 2019-04-08 DIAGNOSIS — I10 Essential (primary) hypertension: Secondary | ICD-10-CM

## 2019-04-08 DIAGNOSIS — C7951 Secondary malignant neoplasm of bone: Secondary | ICD-10-CM

## 2019-04-08 MED ORDER — AMLODIPINE 2.5 MG TABLET
ORAL_TABLET | Freq: Every day | ORAL | 11 refills | 30 days | Status: CP
Start: 2019-04-08 — End: 2019-05-12
  Filled 2019-04-09: qty 30, 30d supply, fill #0

## 2019-04-09 ENCOUNTER — Institutional Professional Consult (permissible substitution): Admit: 2019-04-09 | Discharge: 2019-04-09 | Payer: MEDICARE

## 2019-04-09 ENCOUNTER — Ambulatory Visit: Admit: 2019-04-09 | Discharge: 2019-04-09 | Payer: MEDICARE

## 2019-04-09 ENCOUNTER — Ambulatory Visit
Admit: 2019-04-09 | Discharge: 2019-04-09 | Payer: MEDICARE | Attending: Hematology & Oncology | Primary: Hematology & Oncology

## 2019-04-09 DIAGNOSIS — R2689 Other abnormalities of gait and mobility: Secondary | ICD-10-CM

## 2019-04-09 DIAGNOSIS — R42 Dizziness and giddiness: Secondary | ICD-10-CM

## 2019-04-09 DIAGNOSIS — Z87891 Personal history of nicotine dependence: Secondary | ICD-10-CM

## 2019-04-09 DIAGNOSIS — K7689 Other specified diseases of liver: Secondary | ICD-10-CM

## 2019-04-09 DIAGNOSIS — C7951 Secondary malignant neoplasm of bone: Secondary | ICD-10-CM

## 2019-04-09 DIAGNOSIS — R531 Weakness: Secondary | ICD-10-CM

## 2019-04-09 DIAGNOSIS — R112 Nausea with vomiting, unspecified: Secondary | ICD-10-CM

## 2019-04-09 DIAGNOSIS — R748 Abnormal levels of other serum enzymes: Secondary | ICD-10-CM

## 2019-04-09 DIAGNOSIS — E0789 Other specified disorders of thyroid: Secondary | ICD-10-CM

## 2019-04-09 DIAGNOSIS — R9082 White matter disease, unspecified: Secondary | ICD-10-CM

## 2019-04-09 DIAGNOSIS — K921 Melena: Secondary | ICD-10-CM

## 2019-04-09 DIAGNOSIS — D649 Anemia, unspecified: Secondary | ICD-10-CM

## 2019-04-09 DIAGNOSIS — R197 Diarrhea, unspecified: Secondary | ICD-10-CM

## 2019-04-09 DIAGNOSIS — I10 Essential (primary) hypertension: Secondary | ICD-10-CM

## 2019-04-09 DIAGNOSIS — N179 Acute kidney failure, unspecified: Secondary | ICD-10-CM

## 2019-04-09 DIAGNOSIS — C61 Malignant neoplasm of prostate: Secondary | ICD-10-CM

## 2019-04-09 DIAGNOSIS — Z79899 Other long term (current) drug therapy: Secondary | ICD-10-CM

## 2019-04-09 MED ORDER — APALUTAMIDE 60 MG TABLET
ORAL_TABLET | Freq: Every day | ORAL | 11 refills | 30 days | Status: CP
Start: 2019-04-09 — End: 2019-05-12

## 2019-04-09 MED FILL — AMLODIPINE 2.5 MG TABLET: 30 days supply | Qty: 30 | Fill #0 | Status: AC

## 2019-05-12 ENCOUNTER — Ambulatory Visit
Admit: 2019-05-12 | Discharge: 2019-05-12 | Payer: MEDICARE | Attending: Hematology & Oncology | Primary: Hematology & Oncology

## 2019-05-12 ENCOUNTER — Ambulatory Visit: Admit: 2019-05-12 | Discharge: 2019-05-12 | Payer: MEDICARE

## 2019-05-12 ENCOUNTER — Institutional Professional Consult (permissible substitution): Admit: 2019-05-12 | Discharge: 2019-05-12 | Payer: MEDICARE

## 2019-05-12 DIAGNOSIS — C61 Malignant neoplasm of prostate: Principal | ICD-10-CM

## 2019-05-12 DIAGNOSIS — C7951 Secondary malignant neoplasm of bone: Principal | ICD-10-CM

## 2019-05-12 MED ORDER — PROCHLORPERAZINE MALEATE 10 MG TABLET: 10 mg | tablet | Freq: Four times a day (QID) | 2 refills | 8 days | Status: AC

## 2019-05-12 MED ORDER — APALUTAMIDE 60 MG TABLET: 240 mg | tablet | Freq: Every day | 11 refills | 30 days

## 2019-05-14 MED FILL — PROCHLORPERAZINE MALEATE 10 MG TABLET: ORAL | 8 days supply | Qty: 30 | Fill #0

## 2019-05-14 MED FILL — PROCHLORPERAZINE MALEATE 10 MG TABLET: 8 days supply | Qty: 30 | Fill #0 | Status: AC

## 2019-06-04 ENCOUNTER — Institutional Professional Consult (permissible substitution): Admit: 2019-06-04 | Discharge: 2019-06-05 | Payer: MEDICARE

## 2019-06-04 DIAGNOSIS — C61 Malignant neoplasm of prostate: Principal | ICD-10-CM

## 2019-07-14 ENCOUNTER — Ambulatory Visit
Admit: 2019-07-14 | Discharge: 2019-07-14 | Payer: MEDICARE | Attending: Hematology & Oncology | Primary: Hematology & Oncology

## 2019-07-14 ENCOUNTER — Ambulatory Visit: Admit: 2019-07-14 | Discharge: 2019-07-14 | Payer: MEDICARE

## 2019-07-14 ENCOUNTER — Institutional Professional Consult (permissible substitution): Admit: 2019-07-14 | Discharge: 2019-07-14 | Payer: MEDICARE

## 2019-07-14 DIAGNOSIS — C61 Malignant neoplasm of prostate: Principal | ICD-10-CM

## 2019-07-14 DIAGNOSIS — C7951 Secondary malignant neoplasm of bone: Principal | ICD-10-CM

## 2019-07-14 MED ORDER — APALUTAMIDE 60 MG TABLET
ORAL_TABLET | Freq: Every day | ORAL | 11 refills | 30 days | Status: CP
Start: 2019-07-14 — End: 2019-08-13

## 2019-07-15 DIAGNOSIS — C61 Malignant neoplasm of prostate: Principal | ICD-10-CM

## 2019-08-22 DIAGNOSIS — C61 Malignant neoplasm of prostate: Principal | ICD-10-CM

## 2019-08-25 MED ORDER — ERLEADA 60 MG TABLET
ORAL_TABLET | 10 refills | 0 days | Status: CP
Start: 2019-08-25 — End: ?

## 2019-09-08 ENCOUNTER — Institutional Professional Consult (permissible substitution): Admit: 2019-09-08 | Discharge: 2019-09-08 | Payer: MEDICARE

## 2019-09-08 ENCOUNTER — Ambulatory Visit: Admit: 2019-09-08 | Discharge: 2019-09-08 | Payer: MEDICARE

## 2019-09-08 DIAGNOSIS — C7951 Secondary malignant neoplasm of bone: Principal | ICD-10-CM

## 2019-09-08 DIAGNOSIS — C61 Malignant neoplasm of prostate: Principal | ICD-10-CM

## 2019-09-12 ENCOUNTER — Ambulatory Visit: Admit: 2019-09-12 | Discharge: 2019-09-13 | Payer: MEDICARE

## 2019-09-12 DIAGNOSIS — I10 Essential (primary) hypertension: Principal | ICD-10-CM

## 2019-09-12 DIAGNOSIS — C61 Malignant neoplasm of prostate: Principal | ICD-10-CM

## 2019-09-12 DIAGNOSIS — E669 Obesity, unspecified: Principal | ICD-10-CM

## 2019-09-12 DIAGNOSIS — C7951 Secondary malignant neoplasm of bone: Principal | ICD-10-CM

## 2019-09-12 DIAGNOSIS — R635 Abnormal weight gain: Principal | ICD-10-CM

## 2019-09-12 DIAGNOSIS — E785 Hyperlipidemia, unspecified: Principal | ICD-10-CM

## 2019-09-12 DIAGNOSIS — M1990 Unspecified osteoarthritis, unspecified site: Principal | ICD-10-CM

## 2019-09-30 MED ORDER — ATORVASTATIN 10 MG TABLET
ORAL_TABLET | Freq: Every day | ORAL | 1 refills | 90.00000 days | Status: CP
Start: 2019-09-30 — End: 2020-09-29

## 2019-11-03 ENCOUNTER — Ambulatory Visit: Admit: 2019-11-03 | Discharge: 2019-11-03 | Payer: MEDICARE

## 2019-11-03 ENCOUNTER — Institutional Professional Consult (permissible substitution): Admit: 2019-11-03 | Discharge: 2019-11-03 | Payer: MEDICARE

## 2019-11-03 DIAGNOSIS — C61 Malignant neoplasm of prostate: Principal | ICD-10-CM

## 2019-11-03 DIAGNOSIS — C7951 Secondary malignant neoplasm of bone: Principal | ICD-10-CM

## 2019-11-10 DIAGNOSIS — C61 Malignant neoplasm of prostate: Principal | ICD-10-CM

## 2019-11-11 ENCOUNTER — Ambulatory Visit
Admit: 2019-11-11 | Discharge: 2019-11-12 | Payer: MEDICARE | Attending: Hematology & Oncology | Primary: Hematology & Oncology

## 2019-11-11 DIAGNOSIS — C7951 Secondary malignant neoplasm of bone: Principal | ICD-10-CM

## 2019-11-11 DIAGNOSIS — C61 Malignant neoplasm of prostate: Principal | ICD-10-CM

## 2019-11-11 MED ORDER — LIDOCAINE 5 % TOPICAL PATCH
MEDICATED_PATCH | TRANSDERMAL | 6 refills | 20 days | Status: CP
Start: 2019-11-11 — End: ?

## 2019-11-11 MED ORDER — ERLEADA 60 MG TABLET
ORAL_TABLET | Freq: Every day | ORAL | 11 refills | 30.00000 days | Status: CP
Start: 2019-11-11 — End: ?
  Filled 2019-11-20: qty 120, 30d supply, fill #0

## 2019-11-12 DIAGNOSIS — C61 Malignant neoplasm of prostate: Principal | ICD-10-CM

## 2019-11-14 NOTE — Unmapped (Signed)
[facility]   Patient Onboarding/Medication Counseling    Pt has been on medication previously    DDI: apalutamide may decrease effectiveness of atorvastatin.  Pt currently on atorvastatin 10 mg tablets.  May need increase in dose based on efficacy - spoke with daughter and pt will need to contact PCP. Daughter is aware to continue with his Eligard shots and continue daily Ca + Vit D supplements.  She is aware he will stop other chemo med for prostate cancer.  Pt will call [facility] to confirm name of medication before starting apalutamide.     Jeffery Tyler is a 74 y.o. male with Prostate cancer who I am counseling today on initiation of therapy.  I am speaking to the patient's family member, daughter Jeffery Tyler.    Was a Nurse, learning disability used for this call? No    Verified patient's date of birth / HIPAA.    Specialty medication(s) to be sent: Hematology/Oncology: [name]      Non-specialty medications/supplies to be sent: none      Medications not needed at this time: none         Erleada (Apalutamide)    Medication & Administration     Dosage:  Take 4 tablets (240 mg total) by mouth daily. Swallow tablets whole.    Administration:   ??? Take at the same time each day  ??? Take with or without food  ??? Take with calcium and vitamin D  ??? Swallow tablets whole  ??? Take with continuous androgen deprivation therapy: Leuprolide    Adherence/Missed dose instructions:  ??? Take a missed dose as soon as you think about it.  ??? If you do not think about the missed dose until the next day, skip the missed dose and resume your normal dosing schedule.  ??? Do not take 2 doses at the same time or extra doses    Goals of Therapy     ??? Prevent disease progression    Side Effects & Monitoring Parameters     ??? Common side effects  ??? Feeling weak or tired  ??? Skin rash/itching  ??? Diarrhea, nausea, decreased appetite  ??? Joint pain  ??? Weight loss  ??? Hot flashes    ??? The following side effects should be reported to the provider:  ??? Allergic reaction  ??? Signs of high blood pressure (severe headache, dizziness, passing out, vision changes)  ??? Falling/fractures  ??? Thyroid issues (constipation; difficulty handling heat or cold; trouble with memory; mood changes; or burning, numbness, or tingling feeling)  ??? High blood sugar (confusion, fatigue, increased thirst, increased hunger, passing a lot of urine, flushing, fast breathing, or fruity breath)  ??? High potassium (abnormal heartbeat, confusion, dizziness, passing out, weakness, shortness of breath, or numbness or tingling feeling)  ??? Infection (fever, chills, sore throat)    ??? Monitoring Parameters  ??? Monitor TSH levels    Contraindications, Warnings, & Precautions     ??? Cardiac events  ??? Dermatologic toxicity, specifically rash  ??? Falls - evaluate patient for fall risk  ??? Fractures - evaluate patient for fracture risk  ??? Seizures  ??? Thyroid dysfunction  ??? Males of reproductive potential should use effective contraception during therapy and for 3 months after the last dose  ??? Not approved for use in females    Drug/Food Interactions     ??? Medication list reviewed in Epic. The patient was instructed to inform the care team before taking any new medications or supplements. apalutamide (  CYP3A4 inducer - strong) may increase metabolism of atorvastatin (CYP3A4 substrate).  Patient has been on medication previously.  Will route message to CPP for clincal evaluation..     Storage, Handling Precautions, & Disposal     ??? Store at room temperature away from light and moisture  ??? Do not throw away or flush unused medication down the toilet or sink. This drug is considered hazardous and should be handled as little as possible.  If someone else helps with medication administration, they should wear gloves.      Current Medications (including OTC/herbals), Comorbidities and Allergies     Current Outpatient Medications   Medication Sig Dispense Refill   ??? acetaminophen (TYLENOL) 500 MG tablet Take 500 mg by mouth every six (6) hours as needed for pain.     ??? apalutamide (ERLEADA) 60 mg tablet Take 4 tablets (240 mg total) by mouth daily. Swallow tablets whole. 120 tablet 11   ??? atorvastatin (LIPITOR) 10 MG tablet Take 1 tablet (10 mg total) by mouth daily. 90 tablet 1   ??? calcium carbonate (CALCIUM 600) 600 mg calcium (1,500 mg) tablet Take 1 tablet by mouth daily.     ??? cholecalciferol, vitamin D3, (VITAMIN D3) 50 mcg (2,000 unit) cap Take by mouth.     ??? lidocaine (LIDODERM) 5 % patch Place 1 patch on the skin daily. Apply to affected area for 12 hours only each day (then remove patch) 20 patch 6   ??? naproxen sodium (ALEVE) 220 MG tablet Take 220 mg by mouth two (2) times a day as needed for pain.       No current facility-administered medications for this visit.       No Known Allergies    Patient Active Problem List   Diagnosis   ??? Hypertensive emergency   ??? Brain mass   ??? Bony metastasis (CMS-HCC)   ??? 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 Cancer (CMS-HCC)   ??? Hypertension   ??? Hyperglycemia   ??? Elevated serum creatinine   ??? Dyslipidemia   ??? Acute kidney injury (CMS-HCC)   ??? Hyperkalemia   ??? Dehydration   ??? Obesity   ??? DJD (degenerative joint disease)       Reviewed and up to date in Epic.    Appropriateness of Therapy     Is medication and dose appropriate based on diagnosis? Yes    Prescription has been clinically reviewed: Yes    Baseline Quality of Life Assessment      How many days over the past month did your prostate cancer  keep you from your normal activities? For example, brushing your teeth or getting up in the morning. 0    Financial Information     Medication Assistance provided: [employer] Assistance    Anticipated copay of $0/30 days reviewed with patient. Verified delivery address.    Delivery Information     Scheduled delivery date: 11/21/19    Expected start date: 11/21/19    Medication will be delivered via Next Day Courier to the prescription address in [facility]. This shipment will not require a signature.      Explained the services we provide at [facility] and that each month we would call to set up refills.  Stressed importance of returning phone calls so that we could ensure they receive their medications in time each month.  Informed patient that we should be setting up refills 7-10  days prior to when they will run out of medication.  A pharmacist will reach out to perform a clinical assessment periodically.  Informed patient that a welcome packet and a drug information handout will be sent.      Patient verbalized understanding of the above information as well as how to contact the pharmacy at [phone] option 4 with any questions/concerns.  The pharmacy is open Monday through Friday 8:30am-4:30pm.  A pharmacist is available 24/7 via pager to answer any clinical questions they may have.    Patient Specific Needs     - Does the patient have any physical, cognitive, or cultural barriers? No    - Patient prefers to have medications discussed with  Family Member     - Is the patient or caregiver able to read and understand education materials at a high school level or above? Yes    - Patient's primary language is  English     - Is the patient high risk? Yes, patient is taking oral chemotherapy. Appropriateness of therapy has been assessed.     - Does the patient require a Care Management Plan? No     - Does the patient require physician intervention or other additional services (i.e. nutrition, smoking cessation, social work)? No      Sota Hetz [facility] Specialty Pharmacist

## 2019-11-14 NOTE — Unmapped (Signed)
[facility] Specialty Medication Onboarding    Specialty Medication: ERLEADA  Prior Authorization: Not Required   Financial Assistance: Yes - grant approved as secondary   Final Copay/Day Supply: $0 / 30 DAYS    Insurance Restrictions: Yes - max 1 month supply     Notes to Pharmacist:     The triage team has completed the benefits investigation and has determined that the patient is able to fill this medication at [facility]. Please contact the patient to complete the onboarding or follow up with the prescribing physician as needed.

## 2019-11-17 NOTE — Unmapped (Signed)
Attempted to call pt with PSA results. No answer or V/M available.

## 2019-11-20 MED FILL — ERLEADA 60 MG TABLET: 30 days supply | Qty: 120 | Fill #0 | Status: AC

## 2019-12-15 NOTE — Unmapped (Signed)
[facility] Clinical Assessment & Refill Coordination Note    Jeffery Tyler, DOB: 10-31-1945  Phone: [phone] (home)     All above HIPAA information was verified with patient.     Was a Nurse, learning disability used for this call? No    Specialty Medication(s):   Hematology/Oncology: [name]     Current Outpatient Medications   Medication Sig Dispense Refill   ??? acetaminophen (TYLENOL) 500 MG tablet Take 500 mg by mouth every six (6) hours as needed for pain.     ??? apalutamide (ERLEADA) 60 mg tablet Take 4 tablets (240 mg total) by mouth daily. Swallow tablets whole. 120 tablet 11   ??? atorvastatin (LIPITOR) 10 MG tablet Take 1 tablet (10 mg total) by mouth daily. 90 tablet 1   ??? calcium carbonate (CALCIUM 600) 600 mg calcium (1,500 mg) tablet Take 1 tablet by mouth daily.     ??? cholecalciferol, vitamin D3, (VITAMIN D3) 50 mcg (2,000 unit) cap Take by mouth.     ??? lidocaine (LIDODERM) 5 % patch Place 1 patch on the skin daily. Apply to affected area for 12 hours only each day (then remove patch) 20 patch 6   ??? naproxen sodium (ALEVE) 220 MG tablet Take 220 mg by mouth two (2) times a day as needed for pain.       No current facility-administered medications for this visit.        Changes to medications: Jebadiah Reports stopping the following medications: acetaminophen and pain patches    No Known Allergies    Changes to allergies: No    SPECIALTY MEDICATION ADHERENCE     Erleada 60 mg: 15 days of medicine on hand (used up previous bottle of meds before starting Fill from [facility])    Medication Adherence    Patient reported X missed doses in the last month: 0  Specialty Medication: apalutamide 60 mg (240 mg daily)  Patient is on additional specialty medications: No  Informant: patient  Confirmed plan for next specialty medication refill: delivery by pharmacy          Specialty medication(s) dose(s) confirmed: Regimen is correct and unchanged.     Are there any concerns with adherence? No Adherence counseling provided? Not needed    CLINICAL MANAGEMENT AND INTERVENTION      Clinical Benefit Assessment:    Do you feel the medicine is effective or helping your condition? Yes    Clinical Benefit counseling provided? Not needed    Adverse Effects Assessment:    Are you experiencing any side effects? No    Are you experiencing difficulty administering your medicine? No    Quality of Life Assessment:    How many days over the past month did your prostate cancer  keep you from your normal activities? For example, brushing your teeth or getting up in the morning. 0    Have you discussed this with your provider? Not needed    Therapy Appropriateness:    Is therapy appropriate? Yes, therapy is appropriate and should be continued    DISEASE/MEDICATION-SPECIFIC INFORMATION      N/A    PATIENT SPECIFIC NEEDS     - Does the patient have any physical, cognitive, or cultural barriers? No    - Is the patient high risk? Yes, patient is taking oral chemotherapy. Appropriateness of therapy as been assessed.     - Does the patient require a Care Management Plan? No     - Does the patient  require physician intervention or other additional services (i.e. nutrition, smoking cessation, social work)? No      SHIPPING     Specialty Medication(s) to be Shipped:   Hematology/Oncology: [name]    Other medication(s) to be shipped: none     Changes to insurance: No    Delivery Scheduled: Yes, Expected medication delivery date: 12/30/19.     Medication will be delivered via Same Day Courier to the confirmed prescription address in [facility].    The patient will receive a drug information handout for each medication shipped and additional FDA Medication Guides as required.  Verified that patient has previously received a Conservation officer, historic buildings.    All of the patient's questions and concerns have been addressed.    Breck Coons Shared [facility] Specialty Pharmacist

## 2019-12-17 DIAGNOSIS — C61 Malignant neoplasm of prostate: Principal | ICD-10-CM

## 2019-12-17 NOTE — Unmapped (Unsigned)
Thank you for your e-Consult.    To summarize: [name] is an 74 y.o. 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 His family history is notable for two brothers with prostate cancer and a mother with breast cancer and leukemia at undefined ages. Genetic testing of BRCA1, BRCA2, ATM, BARD1, BRIP1, FANCA, CHEK2, HOXB13, MLH1, MSH2, MSH6, PMS2, EPCAM, PALB2, RAD51C, RAD51D, TP53 was performed by [facility] and shows no reportable variants.      My assessment is:   Testing was normal. It did not identify any pathogenic or likely pathogenic variants, or variants of unknown significance associated with prostate cancer predisposition. Though he does have two brothers with prostate cancer this genetic testing gives evidence that it is likely due to the fact that prostate cancer is very common and thus may be in multiple family members purely by chance. His results suggest that the probability of a hereditary cancer predisposition syndrome is extremely low.     Family members may still be at increased risk to develop various cancers, based simply on the family history of cancers. Changes in [name] personal or family history might alter our assessment of the chance that his cancer or those in the family were due to a hereditary factor. In addition, future advances in genetic testing might lead to additional ways to assess cancer risk in patients with negative genetic testing for Mendelian cancer predisposition syndromes.    My recommendations are:   1. We do not recommend additional germline genetic testing in [name] at this time  2. Family members may wish to discuss the family history with their providers to determine appropriate cancer screening  3. Further consultation with the [facility] is available if needed in the future      I spent 5-10 minutes in medical consultative discussion and review of medical records, including a written report to the treating provider via electronic health record regarding the condition of this patient.  This e-Consult did include an answerable clinical question and did not recommend a clinic visit.    Raia Amico, CGC    The recommendations provided in this eConsult are based on the clinical data available to me and are furnished without the benefit of a comprehensive in-person evaluation of the patient. Any new clinical issues or changes in patient status not available to me will need to be taken into account when assessing these recommendations. The ongoing management of this patient is the responsibility of the referring clinician. Please contact me if you have further questions.

## 2019-12-25 ENCOUNTER — Other Ambulatory Visit: Admit: 2019-12-25 | Discharge: 2019-12-26 | Payer: MEDICARE

## 2019-12-25 ENCOUNTER — Ambulatory Visit
Admit: 2019-12-25 | Discharge: 2019-12-25 | Payer: MEDICARE | Attending: Hematology & Oncology | Primary: Hematology & Oncology

## 2019-12-25 DIAGNOSIS — C61 Malignant neoplasm of prostate: Principal | ICD-10-CM

## 2019-12-25 DIAGNOSIS — C7951 Secondary malignant neoplasm of bone: Principal | ICD-10-CM

## 2019-12-25 LAB — CREATININE: EGFR CKD-EPI NON-AA MALE: 60 mL/min/{1.73_m2} (ref >=60)

## 2019-12-25 LAB — CALCIUM: Calcium:MCnc:Pt:Ser/Plas:Qn:: 9.6

## 2019-12-25 LAB — EGFR CKD-EPI NON-AA MALE
Glomerular filtration rate/1.73 sq M.predicted.non black:ArVRat:Pt:Ser/Plas/Bld:Qn:Creatinine-based formula (CKD-EPI): 60

## 2019-12-25 LAB — ALBUMIN: Albumin:MCnc:Pt:Ser/Plas:Qn:: 4.3

## 2019-12-25 LAB — PROSTATE SPECIFIC ANTIGEN: Prostate specific Ag:MCnc:Pt:Ser/Plas:Qn:: 4.14 — ABNORMAL HIGH

## 2019-12-25 LAB — PHOSPHORUS: Phosphate:MCnc:Pt:Ser/Plas:Qn:: 3.7

## 2019-12-25 NOTE — Unmapped (Addendum)
Lab Results   Component Value Date    PSA 4.14 (H) 12/25/2019    PSA 5.64 (H) 11/11/2019    PSA 8.13 (H) 07/14/2019    PSA 12.80 (H) 05/12/2019    PSA 29.10 (H) 04/09/2019    PSA 17.50 (H) 03/04/2019   Your genetic testing came back negative.    Continue with the same medication. Injection today.      Please call [phone] to reach my nurse navigator Mauricia Area for any issues.    For emergencies on Nights, Weekends and Holidays  Call [phone] and ask for the hematology/oncology on call.    Griffin Basil, MD, PhD  Associate Professor of Medicine  Division of Hematology-Oncology    [facility]  Genitourinary Oncology Clinic  Nurse Navigator: Mauricia Area  Fax: [phone]

## 2019-12-25 NOTE — Unmapped (Signed)
Eligard given 45mg  given subcutaneous RUA per [facility] LPN gauze and band aid applied.

## 2019-12-25 NOTE — Unmapped (Signed)
[facility] Medical Oncology Visit Note    Patient Name: Jeffery Tyler  Patient Age: 74 y.o.  Encounter Date: 12/25/2019  Attending Provider:  Obera Stauch E. Philomena Course, MD  Referring physician: Jenell Milliner, MD    Assessment  Patient Active Problem List   Diagnosis   ??? Hypertensive emergency   ??? Brain mass   ??? Bony metastasis (CMS-HCC)   ??? 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 Cancer (CMS-HCC)   ??? Hypertension   ??? Hyperglycemia   ??? Elevated serum creatinine   ??? Dyslipidemia   ??? Acute kidney injury (CMS-HCC)   ??? Hyperkalemia   ??? Dehydration   ??? Obesity   ??? DJD (degenerative joint disease)     1. Metastatic hormone sensitive prostate cancer  2. Bone metastases    A 74 y.o. man with metastatic prostate cancer.  No histologic diagnosis of prostate cancer. On ADT plus apalutamide. PSA 6200 at presentation, decreased in response to treatment.    Pt on ADT plus apalutamide and PSA continues to decrease, down to 4.14 today. Discussed the results of germline testing, which came back negative.    Plan  1. Continue ADT, Eligard 45 mg given today on 12/25/2019, due next on 06/26/2020.  2. Continue with apalutamide.  3. Germline testing negative for pathogenic variants. No additional testing required.  4. Bone support - Continue calcium Vitamin D.  Denosumab will not be continued, since pt's disease is hormone sensitive and the risk of SRE is low.  5. Return in 3 months    I personally spent 40 minutes face-to-face and non-face-to-face in the care of this patient, which includes all pre, intra, and post visit time on the date of service.    Reason for Visit  Follow up of prostate cancer    History of Present Illness:  Oncology History Overview Note   ?? In 09/2018, presented with diffuse bone mets, PSA 6200.  ?? In 10/2018, ADT started with Degarelix, then continued with Eligard  ?? In 10/2018, denosumab started, continued till 10/2019  ?? In 10/2018, apalutamide started     Prostate cancer, primary, with metastasis from prostate to other site (CMS-HCC)   10/15/2018 -  Cancer Staged    MRI Brain:  Enhancing right clival mass, with differential including metastasis, plasmacytoma, much less likely chordoma or chondrosarcoma. CT would be helpful for evaluation of the bone.  Remote hemorrhage in the left cerebellar hemisphere. No acute ischemia or hemorrhage.   Chronic microvascular ischemic changes.     10/16/2018 -  Cancer Staged    CT Head:  Heterogeneous enhancement right clival mass with partial encasement of the right cavernous ICA and osseous destruction of the sphenoid sinus posterior wall. Query a right cavernous ICA aneurysm measuring 0.8 cm versus superior extension of the clival mass.     10/16/2018 -  Cancer Staged    CT maxillofacial: 2.2 cm clival mass.  Mixed sclerotic/lytic appearance of the C1-C5 vertebra is concerning for osseous metastases.     10/17/2018 -  Cancer Staged    CT Chest:  3.9 cm partially calcified left thyroid mass. Further evaluation with dedicated ultrasound is recommended.  0.2 cm right upper lobe subpleural nodule ; indeterminate. Recommend follow up CT in 3 months for further assessment.??  Expansile soft tissue metastatic lesion involving the right anterolateral fourth rib.  Diffusely sclerotic/lytic appearance of the axial and proximal appendicular skeleton is concerning for extensive osseous metastatic disease.  10/17/2018 -  Cancer Staged    CT AP:  Diffuse osseous metastatic disease with erosive deformities most significantly involving the T10 and L3 vertebral bodies. Significant extension of metastases into the epidural space with narrowing of the cord at the level of the L3 vertebral body. T??  Multifocal hepatic metastases measuring up to 3.8 cm within segment 3.  Indeterminant 1.4 cm upper left renal pole hypodensity. This may be further evaluated with multiphasic CT renal mass protocol or MRI if clinically desired. Alternatively, attention at follow-up is recommended. 10/23/2018 Initial Diagnosis    Prostate cancer, primary, with metastasis from prostate to other site (CMS-HCC): PSA 6200     10/28/2018 -  Cancer Staged    Bone scan:  Diffuse foci of uptake in the appendicular and axial skeleton as above, concerning for extensive osseous metastatic disease.        10/30/2018 Biopsy    Liver, core biopsy  - Minute fragments of liver parenchyma with centrilobular congestion, no tumor identified (see comment)     10/30/2018 - 10/30/2018 Endocrine/Hormone Therapy    OP DEGARELIX  Plan Provider: Towanda Malkin, MD     12/02/2018 - 03/04/2019 Endocrine/Hormone Therapy    OP LEUPROLIDE (22.5 MG EVERY 3 MONTHS)  Plan Provider: Towanda Malkin, MD     03/04/2019 -  Cancer Staged    CT CAP:  Interval slightly increased sclerosis of diffuse osseous metastasis with mild decrease in size of soft tissue mass associated with right fourth rib lesion, suggestive of treatment response.  L thyroid nodule stable.  --No pulmonary parenchymal metastasis    --Stable appearance of hepatic lesions.        06/04/2019 - 09/08/2019 Endocrine/Hormone Therapy    OP LEUPROLIDE (ELIGARD) 22.5 MG EVERY 3 MONTHS  Plan Provider: Towanda Malkin, MD     12/25/2019 Endocrine/Hormone Therapy    OP LEUPROLIDE (ELIGARD) 45 MG EVERY 6 MONTHS  Plan Provider: Maurie Boettcher, MD         Current therapy  ADT with Eligard  Apalutamide    Interval history  The patient returns for scheduled follow up, accompanied by daughter. Pt notes that he has been doing well, except he has had frequent hot flashes chronically.  No other issues brought up.      Allergies:  No Known Allergies    Current Medications:    Current Outpatient Medications:   ???  acetaminophen (TYLENOL) 500 MG tablet, Take 500 mg by mouth every six (6) hours as needed for pain. (Patient not taking: Reported on 12/25/2019), Disp: , Rfl:   ???  apalutamide (ERLEADA) 60 mg tablet, Take 4 tablets (240 mg total) by mouth daily. Swallow tablets whole., Disp: 120 tablet, Rfl: 11  ???  atorvastatin (LIPITOR) 10 MG tablet, Take 1 tablet (10 mg total) by mouth daily., Disp: 90 tablet, Rfl: 1  ???  calcium carbonate (CALCIUM 600) 600 mg calcium (1,500 mg) tablet, Take 1 tablet by mouth daily., Disp: , Rfl:   ???  cholecalciferol, vitamin D3, (VITAMIN D3) 50 mcg (2,000 unit) cap, Take by mouth., Disp: , Rfl:   ???  lidocaine (LIDODERM) 5 % patch, Place 1 patch on the skin daily. Apply to affected area for 12 hours only each day (then remove patch) (Patient not taking: Reported on 12/25/2019), Disp: 20 patch, Rfl: 6  ???  naproxen sodium (ALEVE) 220 MG tablet, Take 220 mg by mouth two (2) times a day as needed for pain., Disp: , Rfl:  No current facility-administered medications for this visit.    Past Medical History and Social History  Past Medical History:   Diagnosis Date   ??? Cancer (CMS-HCC)     prostate cancer with bony mets   ??? Hypertension       No past surgical history on file.     Social History     Occupational History   ??? Not on file   Tobacco Use   ??? Smoking status: Former Smoker     Packs/day: 1.00     Years: 3.00     Pack years: 3.00     Types: Cigars     Quit date: 05/31/1960     Years since quitting: 59.6   ??? Smokeless tobacco: Former Neurosurgeon     Types: Snuff     Quit date: 05/31/1960   ??? Tobacco comment: 1-2 cigars daily   Vaping Use   ??? Vaping Use: Never used   Substance and Sexual Activity   ??? Alcohol use: No   ??? Drug use: Never   ??? Sexual activity: Not Currently       Family History  Family History   Problem Relation Age of Onset   ??? Leukemia Mother 99   ??? Breast cancer Mother    ??? Lung cancer Father    ??? Leukemia Sister 63   ??? Liver cancer Brother 85   ??? Tuberculosis Maternal Aunt    ??? Tuberculosis Maternal Uncle    ??? Tuberculosis Maternal Grandmother    ??? Tuberculosis Maternal Grandfather    ??? Stomach cancer Sister 15   ??? Prostate cancer Brother 69   ??? Lung cancer Brother 6        smoker   ??? Stomach cancer Brother    ??? Prostate cancer Brother 85   ??? No Known Problems Daughter    ??? No Known Problems Daughter    ??? No Known Problems Daughter    ??? No Known Problems Daughter    ??? No Known Problems Son      Prostate Cancer Family History Assessment:  History of cancer in children (yes/no; if yes, what type AND age of diagnosis): No   History of cancer in siblings (yes/no; if yes, provide relation, type of cancer, AND age of diagnosis): YES, two brothers with prostate cancer   History of cancer in parents (yes/no; if yes, please specify parent, type of cancer, AND age of diagnosis): YES, mother with leukemia, breast cancer, father with lung cancer   History of cancer in aunts/uncles/grandparents (yes/no; if yes, provide relation, type of cancer, AND age of diagnosis): No       Review of Systems:  A comprehensive review of 10 systems was negative except for pertinent positives noted in HPI.    Physical Exam:    VITAL SIGNS:  BP 144/86  - Pulse 57  - Temp 36.6 ??C (97.9 ??F) (Oral)  - Resp 18  - Ht 188 cm (6' 2)  - Wt (!) 131.5 kg (289 lb 12.8 oz)  - SpO2 100%  - BMI 37.21 kg/m??   ECOG Performance Status: 1  GENERAL: Well-developed, well-nourished patient in no acute distress.  HEAD: Normocephalic and atraumatic.  EYES: Conjunctivae are normal. No scleral icterus.  MOUTH/THROAT: Oropharynx is clear and moist.  No mucosal lesions.  NECK: Supple, no thyromegaly.  LYMPHATICS: No palpable cervical, supraclavicular, or axillary adenopathy.  CARDIOVASCULAR: Normal rate, regular rhythm and normal heart sounds.  Exam reveals no gallop and no friction  rub.  No murmur heard.  PULMONARY/CHEST: Effort normal and breath sounds normal. No respiratory distress.  ABDOMINAL:  Soft. There is no distension. There is no tenderness. There is no rebound and no guarding.  MUSCULOSKELETAL: No clubbing, cyanosis, or lower extremity edema.  PSYCHIATRIC: Alert and oriented.  Normal mood and affect.  NEUROLOGIC: No focal motor deficit. Normal gait.  SKIN: Skin is warm, dry, and intact.      Results/Orders:    Lab on 12/25/2019   Component Date Value Ref Range Status   ??? PSA 12/25/2019 4.14* 0.00 - 4.00 ng/mL Final   ??? Creatinine 12/25/2019 1.21  0.70 - 1.30 mg/dL Final   ??? EGFR CKD-EPI Non-African American,* 12/25/2019 60  >=60 mL/min/1.90m2 Final   ??? EGFR CKD-EPI African American, Male 12/25/2019 69  >=60 mL/min/1.22m2 Final   ??? Calcium 12/25/2019 9.6  8.5 - 10.2 mg/dL Final   ??? Phosphorus 12/25/2019 3.7  2.9 - 4.7 mg/dL Final   ??? Albumin 91/47/8295 4.3  3.5 - 5.0 g/dL Final       Lab Results   Component Value Date    PSA 4.14 (H) 12/25/2019    PSA 5.64 (H) 11/11/2019    PSA 8.13 (H) 07/14/2019    PSA 12.80 (H) 05/12/2019    PSA 29.10 (H) 04/09/2019    PSA 17.50 (H) 03/04/2019    PSA 41.70 (H) 02/05/2019    PSA 68.30 (H) 01/02/2019    PSA 723.00 (H) 12/02/2018    PSA 6,200.00 (H) 10/17/2018       Administrations This Visit     leuprolide (6 month) (ELIGARD) injection 45 mg     Admin Date  12/25/2019 Action  Given Dose  45 mg Route  Subcutaneous Administered By  Minus Breeding, RN                  Orders placed or performed in visit on 12/25/19   ??? PSA (Prostate Specific Antigen)   ??? Creatinine   ??? Calcium   ??? Phosphorus Level   ??? Albumin     Molecular Pathology  Germline testing [facility] prostate panel 11/11/2019  Negative for pathogenic variants    Pathology  No prostate path available      Imaging results:  Bone scan 10/28/2018     Diffuse foci of uptake in the appendicular and axial skeleton as above, concerning for extensive osseous metastatic disease.    CT CAP 10/17/2018  3.9 cm partially calcified left thyroid mass. Further evaluation with dedicated ultrasound is recommended.  ??  0.2 cm right upper lobe subpleural nodule ; indeterminate. Recommend follow up CT in 3 months for further assessment.  ??  Expansile soft tissue metastatic lesion involving the right anterolateral fourth rib.  Diffusely sclerotic/lytic appearance of the axial and proximal appendicular skeleton is concerning for extensive osseous metastatic disease. Diffuse osseous metastatic disease with erosive deformities most significantly involving the T10 and L3 vertebral bodies. Significant extension of metastases into the epidural space with narrowing of the cord at the level of the L3 vertebral body. This was discussed with the neurosurgery ICU at 11:00 AM on 10/17/2018.  ??  Multifocal hepatic metastases measuring up to 3.8 cm within segment 3.  ??  Indeterminant 1.4 cm upper left renal pole hypodensity. This may be further evaluated with multiphasic CT renal mass protocol or MRI if clinically desired. Alternatively, attention at follow-up is recommended.

## 2019-12-26 MED ORDER — ATORVASTATIN 10 MG TABLET
ORAL_TABLET | Freq: Every day | ORAL | 1 refills | 90.00000 days | Status: CP
Start: 2019-12-26 — End: 2020-12-25

## 2019-12-26 NOTE — Unmapped (Signed)
Patient stopped in needing a refill on Lipitor sent in.  He stated he will run out on 12/31/19 & his appt is 01/12/20.

## 2019-12-26 NOTE — Unmapped (Signed)
Abstraction Result Flowsheet Data    This patient's last AWV date: [facility] Last Medicare Wellness Visit Date: Not Found  This patients last WCC/CPE date: : Not Found      Reason for Encounter  Reason for Encounter: Outreach  Primary Reason for Call: AWV  Outreach Call Outcome: Unable to contact  Text Message: No

## 2019-12-30 MED FILL — ERLEADA 60 MG TABLET: ORAL | 30 days supply | Qty: 120 | Fill #1

## 2019-12-30 MED FILL — ERLEADA 60 MG TABLET: 30 days supply | Qty: 120 | Fill #1 | Status: AC

## 2020-01-05 NOTE — Unmapped (Signed)
Assessment/Plan:    Katrina was seen today for follow-up.    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 He will continue with her current medication regimen as scheduled. He was seen by his oncologist on 12/25/2019 and visit details reviewed with the pt today.  See below. His most recent PSA from 2 weeks ago was markedly improved at 4.1.     -History of HTN: pt is currently off HTN medications and he reports his home BP readings remain normal. I encourage him to continue low salt intake and home BP monitoring to ensure normal readings. He had normal creatinine level at 1.2,  on 12/25/2019. He has not been checking his BP at home on a regular basis.     -Dyslipidemia with high LDL: pt counseled on low fat diet. Non fasting lipid panel done in 09/2019 revealed total cholesterol of 322 and LDL of 225.  He was started on atorvastatin 10 mg daily and he is due for repeat lab work today. He was advised on low fat diet and exercise.     -Obesity with recent weight gain: A screening TSH was normal in 09/2019. He is advised on caloric restriction to promote weight loss. His exercise is limited due to DJD history. His weight remains stable..     -DJD: pt gets along well. He uses a can for assistance in walking. He take OTC aleve prn since tylneol does not work.  He knows not to use NSAID's on a regular basis. .          Return in about 4 months (around 05/13/2020) for Recheck.    Subjective:     HPI  The pt is seen today for 4 month follow up visit.  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 He will continue with her current medication regimen as scheduled. He was seen by his oncologist on 12/25/2019 and the following is noted from that encounter:   1. Metastatic hormone sensitive prostate cancer  2. Bone metastases  A 74 y.o. man with metastatic prostate cancer.  No histologic diagnosis of prostate cancer. On ADT plus apalutamide. PSA 6200 at presentation, decreased in response to treatment.  Pt on ADT plus apalutamide and PSA continues to decrease, down to 4.14 today. Discussed the results of germline testing, which came back negative.  Plan  1. Continue ADT, Eligard 45 mg given today on 12/25/2019, due next on 06/26/2020.  2. Continue with apalutamide.  3. Germline testing negative for pathogenic variants. No additional testing required.  4. Bone support - Continue  calcium Vitamin D.  Denosumab will not be continued, since pt's disease is hormone sensitive and the risk of SRE is low.  5. Return in 3 months    His most recent PSA from 2 weeks ago was markedly improved at 4.1.     -History of HTN: pt is currently off HTN medications and he reports his home BP readings remain normal. I encourage him to continue low salt intake and home BP monitoring to ensure normal readings. He had normal creatinine level at 1.2,  on 12/25/2019. He has not been checking his BP at home on a regular basis.     -Dyslipidemia with high LDL: pt counseled on low fat diet. Non fasting lipid panel done in 09/2019 revealed total cholesterol of 322 and LDL of 225.  He was started on atorvastatin 10 mg daily and he is due for repeat lab work today. He was advised on low fat diet and exercise.     -Obesity with recent weight gain: A screening TSH was normal in 09/2019. He is advised on caloric restriction to promote weight loss. His exercise is limited due to DJD history. His weight remains stable..     -DJD: pt gets along well. He uses a can for assistance in walking. He take OTC aleve prn since tylneol does not work.  He knows not to use NSAID's on a regular basis. .        ROS  Constitutional:  Denies  unexpected weight loss or gain, or weakness   Eyes:  Denies visual changes  Respiratory:  Denies cough or shortness of breath. No change in exercise  tolerance  Cardiovascular:  Denies chest pain, palpitations or lower extremity swelling   GI:  Denies abdominal pain, diarrhea, constipation   Musculoskeletal:  Denies myalgias  Skin:  Denies nonhealing lesions  Neurologic:  Denies headache, focal weakness or numbness, tingling  Endocrine:  Denies polyuria or polydypsia   Psychiatric:  Denies depression, anxiety      Outpatient Medications Prior to Visit   Medication Sig Dispense Refill   ??? acetaminophen (TYLENOL) 500 MG tablet Take 500 mg by mouth every six (6) hours as needed for pain.      ??? apalutamide (ERLEADA) 60 mg tablet Take 4 tablets (240 mg total) by mouth daily. Swallow tablets whole. 120 tablet 11   ??? atorvastatin (LIPITOR) 10 MG tablet Take 1 tablet (10 mg total) by mouth daily. 90 tablet 1   ??? calcium carbonate (CALCIUM 600) 600 mg calcium (1,500 mg) tablet Take 1 tablet by mouth daily.     ??? calcium carbonate-vitamin D3 500 mg(1,250mg ) -400 unit per tablet Chew.     ??? cholecalciferol, vitamin D3, (VITAMIN D3) 50 mcg (2,000 unit) cap Take by mouth.     ??? naproxen sodium (ALEVE) 220 MG tablet Take 220 mg by mouth two (2) times a day as needed for pain.     ??? lidocaine (LIDODERM) 5 % patch Place 1 patch on the skin daily. Apply to affected area for 12 hours only each day (then remove patch) (Patient not taking: Reported on 12/25/2019) 20 patch 6   ??? lisinopriL (PRINIVIL,ZESTRIL) 20 MG tablet Take by mouth. (Patient not taking: Reported on 01/12/2020)       No facility-administered medications prior to visit.         Objective:       Vital Signs  BP 120/70 (BP Site: R Arm, BP Position: Sitting)  - Pulse 69  - Temp 36.7 ??  C (98 ??F) (Oral)  - Ht 188 cm (6' 2)  - Wt (!) 131.1 kg (289 lb)  - SpO2 98%  - BMI 37.11 kg/m??      Exam  General: normal appearance  EYES: Anicteric sclerae.  ENT: Oropharynx moist.  RESP: Relaxed respiratory effort. Clear to auscultation without wheezes or crackles.   CV: Regular rate and rhythm. Normal S1 and S2. No murmurs or gallops.  No lower extremity edema. Posterior tibial pulses are 2+ and symmetric.  abd exam: non tender, no masses, no HSM   MSK: No focal muscle tenderness.  SKIN: Appropriately warm and moist.  NEURO: Stable gait and coordination.    Allergies:     Patient has no known allergies.    Current Medications:     Current Outpatient Medications   Medication Sig Dispense Refill   ??? acetaminophen (TYLENOL) 500 MG tablet Take 500 mg by mouth every six (6) hours as needed for pain.      ??? apalutamide (ERLEADA) 60 mg tablet Take 4 tablets (240 mg total) by mouth daily. Swallow tablets whole. 120 tablet 11   ??? atorvastatin (LIPITOR) 10 MG tablet Take 1 tablet (10 mg total) by mouth daily. 90 tablet 1   ??? calcium carbonate (CALCIUM 600) 600 mg calcium (1,500 mg) tablet Take 1 tablet by mouth daily.     ??? calcium carbonate-vitamin D3 500 mg(1,250mg ) -400 unit per tablet Chew.     ??? cholecalciferol, vitamin D3, (VITAMIN D3) 50 mcg (2,000 unit) cap Take by mouth.     ??? naproxen sodium (ALEVE) 220 MG tablet Take 220 mg by mouth two (2) times a day as needed for pain.       No current facility-administered medications for this visit.           Note - This record has been created using [employer]. Chart creation errors have been sought, but may not always have been located. Such creation errors do not reflect on the standard of medical care.    Jenell Milliner, MD

## 2020-01-12 ENCOUNTER — Ambulatory Visit: Admit: 2020-01-12 | Discharge: 2020-01-13 | Payer: MEDICARE

## 2020-01-12 DIAGNOSIS — Z8679 Personal history of other diseases of the circulatory system: Principal | ICD-10-CM

## 2020-01-12 DIAGNOSIS — C7951 Secondary malignant neoplasm of bone: Principal | ICD-10-CM

## 2020-01-12 DIAGNOSIS — C61 Malignant neoplasm of prostate: Principal | ICD-10-CM

## 2020-01-12 DIAGNOSIS — E669 Obesity, unspecified: Principal | ICD-10-CM

## 2020-01-12 DIAGNOSIS — Z1159 Encounter for screening for other viral diseases: Principal | ICD-10-CM

## 2020-01-12 DIAGNOSIS — M1990 Unspecified osteoarthritis, unspecified site: Principal | ICD-10-CM

## 2020-01-12 DIAGNOSIS — E785 Hyperlipidemia, unspecified: Principal | ICD-10-CM

## 2020-01-12 LAB — LIPID PANEL
CHOLESTEROL: 245 mg/dL — ABNORMAL HIGH (ref 100–199)
HDL CHOLESTEROL: 50 mg/dL (ref 40–59)
NON-HDL CHOLESTEROL: 195 mg/dL
TRIGLYCERIDES: 151 mg/dL — ABNORMAL HIGH (ref 1–149)

## 2020-01-12 LAB — HEPATIC FUNCTION PANEL
ALBUMIN: 4 g/dL (ref 3.5–5.0)
ALT (SGPT): 9 U/L (ref <50)
AST (SGOT): 21 U/L (ref 19–55)
BILIRUBIN DIRECT: <0.1 mg/dL (ref 0.00–0.40)
BILIRUBIN TOTAL: 0.4 mg/dL (ref 0.0–1.2)

## 2020-01-12 LAB — CHOLESTEROL/HDL RATIO SCREEN: Lab: 4.9

## 2020-01-12 LAB — EGFR CKD-EPI NON-AA MALE
Glomerular filtration rate/1.73 sq M.predicted.non black:ArVRat:Pt:Ser/Plas/Bld:Qn:Creatinine-based formula (CKD-EPI): 64

## 2020-01-12 LAB — ALBUMIN: Albumin:MCnc:Pt:Ser/Plas:Qn:: 4

## 2020-01-13 LAB — HEPATITIS C ANTIBODY: Hepatitis C virus Ab:PrThr:Pt:Ser:Ord:: NONREACTIVE

## 2020-01-13 LAB — PROSTATE SPECIFIC ANTIGEN: Prostate specific Ag:MCnc:Pt:Ser/Plas:Qn:: 3.83

## 2020-01-13 NOTE — Unmapped (Signed)
Negative hepatitis C antibody

## 2020-01-13 NOTE — Unmapped (Signed)
Marked improvement in cholesterol panel results with initiation of atorvastatin 10 mg daily.  Hepatic panel is normal.

## 2020-01-15 NOTE — Unmapped (Signed)
Unable to reach Jeffery Tyler after 3 call attempts to inform of covid 19 vaccine eligibility per [place] [employer]HS, and provide resources for local vaccination sites (calls made 11/18/19, 01/13/20 and 01/15/20).     Local vaccination sites per [facility] DHHS website: [facility] [phone], [facility] [phone] and pt's preferred pharmacy [facility] [place] [phone].

## 2020-01-28 NOTE — Unmapped (Signed)
[facility] Refill Coordination Note    Patient complained [place] our [place] and he didn't get our calls.  He used to get medication sent to him automatically from [facility].  Explained to him that we have to speak to him before sending out medication and he should call us when he has about a week on hand.  Offering MyChart messaging but he does not have constant Internet.  I told him we will leave him voicemails for refills and he should call us back as soon as he can find service to order meds in the future.  At this time he states he has enough to last the weekend until Tuesday delivery.     Specialty Medication(s) to be Shipped:   Hematology/Oncology: [name]    Other medication(s) to be shipped: n/a     Jeffery Tyler, DOB: 14-May-1946  Phone: [phone] (home)       All above HIPAA information was verified with patient.     Was a Nurse, learning disability used for this call? No    Completed refill call assessment today to schedule patient's medication shipment from the [facility] [phone]).       Specialty medication(s) and dose(s) confirmed: Regimen is correct and unchanged.   Changes to medications: Jeffery Tyler reports no changes at this time.  Changes to insurance: No  Questions for the pharmacist: No    Confirmed patient received Welcome Packet with first shipment. The patient will receive a drug information handout for each medication shipped and additional FDA Medication Guides as required.       DISEASE/MEDICATION-SPECIFIC INFORMATION        N/A    SPECIALTY MEDICATION ADHERENCE     Medication Adherence    Patient reported X missed doses in the last month: 0  Specialty Medication: Erleada 60mg   Patient is on additional specialty medications: No  Informant: patient                Erleada 60 mg: enough days of medicine on hand --would not say how many          SHIPPING     Shipping address confirmed in Epic.     Delivery Scheduled: Yes, Expected medication delivery date: 7/6.     Medication will be delivered via Same Day Courier to the prescription address in Epic [place].    Julianne Rice   [facility] Specialty Pharmacist

## 2020-01-29 NOTE — Unmapped (Signed)
Abstraction Result Flowsheet Data    This patient's last AWV date: [facility] Last Medicare Wellness Visit Date: Not Found  This patients last WCC/CPE date: : Not Found      Reason for Encounter  Reason for Encounter: Outreach  Primary Reason for Call: AWV  Outreach Call Outcome: Left message  Text Message: No

## 2020-02-03 MED FILL — ERLEADA 60 MG TABLET: 30 days supply | Qty: 120 | Fill #2 | Status: AC

## 2020-02-03 MED FILL — ERLEADA 60 MG TABLET: ORAL | 30 days supply | Qty: 120 | Fill #2

## 2020-02-16 NOTE — Unmapped (Signed)
Abstraction Result Flowsheet Data    This patient's last AWV date: [facility] Last Medicare Wellness Visit Date: Not Found  This patients last WCC/CPE date: : Not Found      Reason for Encounter  Reason for Encounter: Outreach  Primary Reason for Call: AWV  Outreach Call Outcome: No voicemail available (VM not set up)  Text Message: No

## 2020-02-19 NOTE — Unmapped (Signed)
[facility] Refill Coordination Note    Specialty Medication(s) to be Shipped:   Hematology/Oncology: [name]    Other medication(s) to be shipped: n/a     Laurena Slimmer, DOB: 1946-03-25  Phone: [phone] (home)       All above HIPAA information was verified with patient.     Was a Nurse, learning disability used for this call? No    Completed refill call assessment today to schedule patient's medication shipment from the [facility] [phone]).       Specialty medication(s) and dose(s) confirmed: Regimen is correct and unchanged.   Changes to medications: Demetrios reports no changes at this time.  Changes to insurance: No  Questions for the pharmacist: No    Confirmed patient received Welcome Packet with first shipment. The patient will receive a drug information handout for each medication shipped and additional FDA Medication Guides as required.       DISEASE/MEDICATION-SPECIFIC INFORMATION        N/A    SPECIALTY MEDICATION ADHERENCE     Medication Adherence    Patient reported X missed doses in the last month: 0  Specialty Medication: Erleada 60mg   Patient is on additional specialty medications: No  Informant: patient                Erleada 60 mg: 14 days of medicine on hand         SHIPPING     Shipping address confirmed in Epic.     Delivery Scheduled: Yes, Expected medication delivery date: 03/02/20.     Medication will be delivered via Same Day Courier to the prescription address in Epic [place].    Wyatt Mage M Elisabeth Cara   [facility] Specialty Technician

## 2020-03-02 MED FILL — ERLEADA 60 MG TABLET: 30 days supply | Qty: 120 | Fill #3 | Status: AC

## 2020-03-02 MED FILL — ERLEADA 60 MG TABLET: ORAL | 30 days supply | Qty: 120 | Fill #3

## 2020-03-02 NOTE — Unmapped (Signed)
Patient/Caregiver called into to the main line of the [facility].   Care Coordinator transfered patient to AWV line.        Lesia Hausen - High Risk Care Coordinator   [facility]  [address] [place], [place] [address]  p. [phone]   [email or web address]

## 2020-03-02 NOTE — Unmapped (Signed)
Abstraction Result Flowsheet Data    This patient's last AWV date: [facility] Last Medicare Wellness Visit Date: Not Found  This patients last WCC/CPE date: : Not Found      Reason for Encounter  Reason for Encounter: Outreach  Primary Reason for Call: AWV  Outreach Call Outcome: Left message  Text Message: No

## 2020-03-18 ENCOUNTER — Ambulatory Visit
Admit: 2020-03-18 | Discharge: 2020-03-19 | Payer: MEDICARE | Attending: Hematology & Oncology | Primary: Hematology & Oncology

## 2020-03-18 ENCOUNTER — Other Ambulatory Visit: Admit: 2020-03-18 | Discharge: 2020-03-19 | Payer: MEDICARE

## 2020-03-18 DIAGNOSIS — C61 Malignant neoplasm of prostate: Principal | ICD-10-CM

## 2020-03-18 DIAGNOSIS — C7951 Secondary malignant neoplasm of bone: Principal | ICD-10-CM

## 2020-03-18 LAB — EGFR CKD-EPI AA MALE: Glomerular filtration rate/1.73 sq M.predicted.black:ArVRat:Pt:Ser/Plas/Bld:Qn:Creatinine-based formula (CKD-EPI): 78

## 2020-03-18 LAB — CREATININE: EGFR CKD-EPI AA MALE: 78 mL/min/{1.73_m2} (ref >=60)

## 2020-03-18 LAB — PROSTATE SPECIFIC ANTIGEN: Prostate specific Ag:MCnc:Pt:Ser/Plas:Qn:: 2.83

## 2020-03-18 NOTE — Unmapped (Signed)
[facility] Visit Note    Patient Name: Jeffery Tyler  Patient Age: 74 y.o.  Encounter Date: 03/18/2020  Attending Provider:  Kristy Schomburg E. Philomena Course, MD  Referring physician: Jenell Milliner, MD    Assessment  Patient Active Problem List   Diagnosis   ??? Hypertensive emergency   ??? Brain mass   ??? Bony metastasis (CMS-HCC)   ??? 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 Cancer (CMS-HCC)   ??? Hypertension   ??? Hyperglycemia   ??? Elevated serum creatinine   ??? Dyslipidemia   ??? Acute kidney injury (CMS-HCC)   ??? Hyperkalemia   ??? Dehydration   ??? Obesity   ??? DJD (degenerative joint disease)   ??? History of hypertension     1. Metastatic hormone sensitive prostate cancer  2. Bone metastases    A 74 y.o. man with metastatic prostate cancer.  No histologic diagnosis of prostate cancer. On ADT plus apalutamide. PSA 6200 at presentation, decreased in response to treatment.    Pt on ADT plus apalutamide and PSA continues to decrease, down to 2.83 today. I am happy with this continuing response.  Continue with the current treatment.    Plan  1. Continue ADT, Eligard 45 mg given last on 12/25/2019, due next on 06/26/2020.  2. Continue with apalutamide.  3. Germline testing negative for pathogenic variants. No additional testing required.  4. Bone support - Continue calcium Vitamin D.  Denosumab will not be continued, since pt's disease is hormone sensitive and the risk of SRE is low.  5. Discussed Covid vaccination issue. Pt doesn't need a booster intended for immunocompromised patients, but later.  6. Return in 3 months    I personally spent 40 minutes face-to-face and non-face-to-face in the care of this patient, which includes all pre, intra, and post visit time on the date of service.    Reason for Visit  Follow up of prostate cancer    History of Present Illness:  Oncology History Overview Note   ?? In 09/2018, presented with diffuse bone mets, PSA 6200.  ?? In 10/2018, ADT started with Degarelix, then continued with Eligard  ?? In 10/2018, denosumab started, continued till 10/2019  ?? In 10/2018, apalutamide started     Prostate cancer, primary, with metastasis from prostate to other site (CMS-HCC)   10/15/2018 -  Cancer Staged    MRI Brain:  Enhancing right clival mass, with differential including metastasis, plasmacytoma, much less likely chordoma or chondrosarcoma. CT would be helpful for evaluation of the bone.  Remote hemorrhage in the left cerebellar hemisphere. No acute ischemia or hemorrhage.   Chronic microvascular ischemic changes.     10/16/2018 -  Cancer Staged    CT Head:  Heterogeneous enhancement right clival mass with partial encasement of the right cavernous ICA and osseous destruction of the sphenoid sinus posterior wall. Query a right cavernous ICA aneurysm measuring 0.8 cm versus superior extension of the clival mass.     10/16/2018 -  Cancer Staged    CT maxillofacial: 2.2 cm clival mass.  Mixed sclerotic/lytic appearance of the C1-C5 vertebra is concerning for osseous metastases.     10/17/2018 -  Cancer Staged    CT Chest:  3.9 cm partially calcified left thyroid mass. Further evaluation with dedicated ultrasound is recommended.  0.2 cm right upper lobe subpleural nodule ; indeterminate. Recommend follow up CT in 3 months for further assessment.??  Expansile soft tissue metastatic lesion involving the  right anterolateral fourth rib.  Diffusely sclerotic/lytic appearance of the axial and proximal appendicular skeleton is concerning for extensive osseous metastatic disease.     10/17/2018 -  Cancer Staged    CT AP:  Diffuse osseous metastatic disease with erosive deformities most significantly involving the T10 and L3 vertebral bodies. Significant extension of metastases into the epidural space with narrowing of the cord at the level of the L3 vertebral body. T??  Multifocal hepatic metastases measuring up to 3.8 cm within segment 3.  Indeterminant 1.4 cm upper left renal pole hypodensity. This may be further evaluated with multiphasic CT renal mass protocol or MRI if clinically desired. Alternatively, attention at follow-up is recommended.     10/23/2018 Initial Diagnosis    Prostate cancer, primary, with metastasis from prostate to other site (CMS-HCC): PSA 6200     10/28/2018 -  Cancer Staged    Bone scan:  Diffuse foci of uptake in the appendicular and axial skeleton as above, concerning for extensive osseous metastatic disease.        10/30/2018 Biopsy    Liver, core biopsy  - Minute fragments of liver parenchyma with centrilobular congestion, no tumor identified (see comment)     10/30/2018 - 10/30/2018 Endocrine/Hormone Therapy    OP DEGARELIX  Plan Provider: Towanda Malkin, MD     12/02/2018 - 03/04/2019 Endocrine/Hormone Therapy    OP LEUPROLIDE (22.5 MG EVERY 3 MONTHS)  Plan Provider: Towanda Malkin, MD     03/04/2019 -  Cancer Staged    CT CAP:  Interval slightly increased sclerosis of diffuse osseous metastasis with mild decrease in size of soft tissue mass associated with right fourth rib lesion, suggestive of treatment response.  L thyroid nodule stable.  --No pulmonary parenchymal metastasis    --Stable appearance of hepatic lesions.        06/04/2019 - 09/08/2019 Endocrine/Hormone Therapy    OP LEUPROLIDE (ELIGARD) 22.5 MG EVERY 3 MONTHS  Plan Provider: Towanda Malkin, MD     12/25/2019 - 12/25/2019 Endocrine/Hormone Therapy    OP LEUPROLIDE (ELIGARD) 45 MG EVERY 6 MONTHS  Plan Provider: Maurie Boettcher, MD     06/22/2020 Endocrine/Hormone Therapy    OP PROSTATE LEUPROLIDE (ELIGARD) 45 MG EVERY 6 MONTHS  Plan Provider: Maurie Boettcher, MD         Current therapy  ADT with Eligard  Apalutamide    Interval history    The patient returns for scheduled follow up, accompanied by daughter. Pt notes that he has been doing well, except for chronic hot flashes, occurring about 4 times/day. Pt notes urinary frequency/urgency at times.  Otherwise, pt is doing well.    He notes he had his second dose of Moderna vaccine in late July.      Allergies:  No Known Allergies    Current Medications:    Current Outpatient Medications:   ???  acetaminophen (TYLENOL) 500 MG tablet, Take 500 mg by mouth every six (6) hours as needed for pain. , Disp: , Rfl:   ???  apalutamide (ERLEADA) 60 mg tablet, Take 4 tablets (240 mg total) by mouth daily. Swallow tablets whole., Disp: 120 tablet, Rfl: 11  ???  calcium carbonate (CALCIUM 600) 600 mg calcium (1,500 mg) tablet, Take 1 tablet by mouth daily., Disp: , Rfl:   ???  calcium carbonate-vitamin D3 500 mg(1,250mg ) -400 unit per tablet, Chew., Disp: , Rfl:   ???  cholecalciferol, vitamin D3, (VITAMIN D3) 50 mcg (2,000 unit) cap, Take  by mouth., Disp: , Rfl:   ???  naproxen sodium (ALEVE) 220 MG tablet, Take 220 mg by mouth two (2) times a day as needed for pain., Disp: , Rfl:   ???  atorvastatin (LIPITOR) 10 MG tablet, Take 1 tablet (10 mg total) by mouth every evening., Disp: 90 tablet, Rfl: 1    Past Medical History and Social History  Past Medical History:   Diagnosis Date   ??? Cancer (CMS-HCC)     prostate cancer with bony mets   ??? Hypertension       No past surgical history on file.     Social History     Occupational History   ??? Not on file   Tobacco Use   ??? Smoking status: Former Smoker     Packs/day: 1.00     Years: 3.00     Pack years: 3.00     Types: Cigars     Quit date: 05/31/1960     Years since quitting: 59.8   ??? Smokeless tobacco: Former Neurosurgeon     Types: Snuff     Quit date: 05/31/1960   ??? Tobacco comment: 1-2 cigars daily   Vaping Use   ??? Vaping Use: Never used   Substance and Sexual Activity   ??? Alcohol use: No   ??? Drug use: Never   ??? Sexual activity: Not Currently       Family History  Family History   Problem Relation Age of Onset   ??? Leukemia Mother 68   ??? Breast cancer Mother    ??? Lung cancer Father    ??? Leukemia Sister 67   ??? Liver cancer Brother 61   ??? Tuberculosis Maternal Aunt    ??? Tuberculosis Maternal Uncle    ??? Tuberculosis Maternal Grandmother    ??? Tuberculosis Maternal Grandfather    ??? Stomach cancer Sister 20   ??? Prostate cancer Brother 58   ??? Lung cancer Brother 79        smoker   ??? Stomach cancer Brother    ??? Prostate cancer Brother 73   ??? No Known Problems Daughter    ??? No Known Problems Daughter    ??? No Known Problems Daughter    ??? No Known Problems Daughter    ??? No Known Problems Son      Prostate Cancer Family History Assessment:  History of cancer in children (yes/no; if yes, what type AND age of diagnosis): No   History of cancer in siblings (yes/no; if yes, provide relation, type of cancer, AND age of diagnosis): YES, two brothers with prostate cancer   History of cancer in parents (yes/no; if yes, please specify parent, type of cancer, AND age of diagnosis): YES, mother with leukemia, breast cancer, father with lung cancer   History of cancer in aunts/uncles/grandparents (yes/no; if yes, provide relation, type of cancer, AND age of diagnosis): No       Review of Systems:  A comprehensive review of 10 systems was negative except for pertinent positives noted in HPI.    Physical Exam:    VITAL SIGNS:  BP 131/79  - Pulse 70  - Temp 36.2 ??C (97.2 ??F) (Temporal)  - Resp 16  - Ht 188 cm (6' 2)  - Wt (!) 135 kg (297 lb 11.2 oz)  - SpO2 100%  - BMI 38.22 kg/m??   ECOG Performance Status: 1  GENERAL: Well-developed, well-nourished patient in no acute distress.  HEAD: Normocephalic and atraumatic.  EYES:  Conjunctivae are normal. No scleral icterus.  MOUTH/THROAT: Oropharynx is clear and moist.  No mucosal lesions.  NECK: Supple, no thyromegaly.  LYMPHATICS: No palpable cervical, supraclavicular, or axillary adenopathy.  CARDIOVASCULAR: Normal rate, regular rhythm and normal heart sounds.  Exam reveals no gallop and no friction rub.  No murmur heard.  PULMONARY/CHEST: Effort normal and breath sounds normal. No respiratory distress.  ABDOMINAL:  Soft. There is no distension. There is no tenderness. There is no rebound and no guarding.  MUSCULOSKELETAL: No clubbing, cyanosis, or lower extremity edema.  PSYCHIATRIC: Alert and oriented.  Normal mood and affect.  NEUROLOGIC: No focal motor deficit. Normal gait.  SKIN: Skin is warm, dry, and intact.      Results/Orders:    Lab on 03/18/2020   Component Date Value Ref Range Status   ??? PSA 03/18/2020 2.83  0.00 - 4.00 ng/mL Final   ??? Creatinine 03/18/2020 1.10  0.60 - 1.10 mg/dL Final   ??? EGFR CKD-EPI Non-African American,* 03/18/2020 67  >=60 mL/min/1.52m2 Final   ??? EGFR CKD-EPI African American, Male 03/18/2020 80  >=60 mL/min/1.26m2 Final       Lab Results   Component Value Date    PSA 2.83 03/18/2020    PSA 3.83 01/12/2020    PSA 4.14 (H) 12/25/2019    PSA 5.64 (H) 11/11/2019    PSA 8.13 (H) 07/14/2019    PSA 12.80 (H) 05/12/2019    PSA 29.10 (H) 04/09/2019    PSA 17.50 (H) 03/04/2019    PSA 41.70 (H) 02/05/2019    PSA 68.30 (H) 01/02/2019             Orders placed or performed in visit on 03/18/20   ??? Clinic Appointment Request Physician, Lab, Injection   ??? LAB Appointment Request     Molecular Pathology  Germline testing [facility] prostate panel 11/11/2019  Negative for pathogenic variants    Pathology  No prostate path available      Imaging results:  Bone scan 10/28/2018     Diffuse foci of uptake in the appendicular and axial skeleton as above, concerning for extensive osseous metastatic disease.    CT CAP 10/17/2018  3.9 cm partially calcified left thyroid mass. Further evaluation with dedicated ultrasound is recommended.  ??  0.2 cm right upper lobe subpleural nodule ; indeterminate. Recommend follow up CT in 3 months for further assessment.  ??  Expansile soft tissue metastatic lesion involving the right anterolateral fourth rib.  Diffusely sclerotic/lytic appearance of the axial and proximal appendicular skeleton is concerning for extensive osseous metastatic disease.    Diffuse osseous metastatic disease with erosive deformities most significantly involving the T10 and L3 vertebral bodies. Significant extension of metastases into the epidural space with narrowing of the cord at the level of the L3 vertebral body. This was discussed with the neurosurgery ICU at 11:00 AM on 10/17/2018.  ??  Multifocal hepatic metastases measuring up to 3.8 cm within segment 3.  ??  Indeterminant 1.4 cm upper left renal pole hypodensity. This may be further evaluated with multiphasic CT renal mass protocol or MRI if clinically desired. Alternatively, attention at follow-up is recommended.

## 2020-03-18 NOTE — Unmapped (Addendum)
Lab Results   Component Value Date    PSA 2.83 03/18/2020    PSA 3.83 01/12/2020    PSA 4.14 (H) 12/25/2019    PSA 5.64 (H) 11/11/2019    PSA 8.13 (H) 07/14/2019    PSA 12.80 (H) 05/12/2019   PSA going down.    Please call [phone] to reach my nurse navigator Joaquin Bend or Lucia Gaskins for any issues.    For emergencies on Nights, Weekends and Holidays  Call [phone] and ask for the hematology/oncology on call.    Griffin Basil, MD, PhD  Associate Professor of Medicine  Division of Hematology-Oncology    [facility]  Genitourinary Oncology Clinic  Nurse Navigator: Nigel Berthold  Fax: [phone]

## 2020-03-18 NOTE — Unmapped (Signed)
Abstraction Result Flowsheet Data    This patient's last AWV date: [facility] Last Medicare Wellness Visit Date: Not Found  This patients last WCC/CPE date: : Not Found      Reason for Encounter  Reason for Encounter: Outreach  Primary Reason for Call: AWV  Outreach Call Outcome: No voicemail available (vm Full)  Text Message: No

## 2020-03-18 NOTE — Unmapped (Signed)
Labs drawn peripherally by  Waymon Budge Phlebotomist. ;

## 2020-03-22 MED ORDER — ATORVASTATIN 10 MG TABLET
ORAL_TABLET | Freq: Every evening | ORAL | 1 refills | 90.00000 days | Status: CP
Start: 2020-03-22 — End: 2021-03-22

## 2020-03-31 NOTE — Unmapped (Signed)
[facility] Refill Coordination Note    Specialty Medication(s) to be Shipped:   Hematology/Oncology: Jeffery Tyler    Other medication(s) to be shipped: No additional medications requested for fill at this time     Jeffery Tyler, DOB: 02-28-1946  Phone: [phone] (home)       All above HIPAA information was verified with patient.     Was a Nurse, learning disability used for this call? No    Completed refill call assessment today to schedule patient's medication shipment from the [facility] [phone]).       Specialty medication(s) and dose(s) confirmed: Regimen is correct and unchanged.   Changes to medications: Jeffery Tyler reports no changes at this time.  Changes to insurance: No  Questions for the pharmacist: No    Confirmed patient received Welcome Packet with first shipment. The patient will receive a drug information handout for each medication shipped and additional FDA Medication Guides as required.       DISEASE/MEDICATION-SPECIFIC INFORMATION        N/A    SPECIALTY MEDICATION ADHERENCE     Medication Adherence    Patient reported X missed doses in the last month: 0  Specialty Medication: Erleada 60mg   Patient is on additional specialty medications: No  Patient is on more than two specialty medications: No  Any gaps in refill history greater than 2 weeks in the last 3 months: no  Demonstrates understanding of importance of adherence: yes  Informant: patient              Erleada 60mg : Patient has 4 days of medication on hand      SHIPPING     Shipping address confirmed in Epic.     Delivery Scheduled: Yes, Expected medication delivery date: 9/3.     Medication will be delivered via Next Day Courier to the prescription address in Epic [place].    Olga Millers   [facility] Specialty Technician

## 2020-04-02 MED FILL — ERLEADA 60 MG TABLET: ORAL | 30 days supply | Qty: 120 | Fill #4

## 2020-04-02 MED FILL — ERLEADA 60 MG TABLET: 30 days supply | Qty: 120 | Fill #4 | Status: AC

## 2020-04-30 NOTE — Unmapped (Signed)
[facility] Refill Coordination Note    Specialty Medication(s) to be Shipped:   Hematology/Oncology: Jeffery Tyler    Other medication(s) to be shipped: No additional medications requested for fill at this time     Jeffery Tyler, DOB: 04/29/1946  Phone: [phone] (home)       All above HIPAA information was verified with patient.     Was a Nurse, learning disability used for this call? No    Completed refill call assessment today to schedule patient's medication shipment from the [facility] [phone]).       Specialty medication(s) and dose(s) confirmed: Regimen is correct and unchanged.   Changes to medications: Jeffery Tyler reports no changes at this time.  Changes to insurance: No  Questions for the pharmacist: No    Confirmed patient received Welcome Packet with first shipment. The patient will receive a drug information handout for each medication shipped and additional FDA Medication Guides as required.       DISEASE/MEDICATION-SPECIFIC INFORMATION        N/A    SPECIALTY MEDICATION ADHERENCE     Medication Adherence    Patient reported X missed doses in the last month: 0  Specialty Medication: Erleada 60mg   Patient is on additional specialty medications: No  Informant: patient                Erleada 60 mg: 5 days of medicine on hand          SHIPPING     Shipping address confirmed in Epic.     Delivery Scheduled: Yes, Expected medication delivery date: 05/03/20.     Medication will be delivered via Same Day Courier to the prescription address in Epic [place].    Jeffery Tyler   [facility] Specialty Technician

## 2020-05-03 MED FILL — ERLEADA 60 MG TABLET: ORAL | 30 days supply | Qty: 120 | Fill #5

## 2020-05-03 MED FILL — ERLEADA 60 MG TABLET: 30 days supply | Qty: 120 | Fill #5 | Status: AC

## 2020-05-05 NOTE — Unmapped (Signed)
Assessment/Plan:    Jeffery Tyler was seen today for follow-up.    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 He will continue with her current medication regimen which includes ADT plus apalutamide. He was seen by his oncologist on 02/2020 .  His most recent PSA from 02/2020 was improved at 2.8.   He was advised to continue calcium and vitamin D supplementation.    -History of HTN: pt is currently off HTN medications and he reports his home BP readings remain normal. I encourage him to continue low salt intake and home BP monitoring to ensure normal readings. He had normal creatinine level at 1.2,  on 12/25/2019. Repeat creatinine level in 02/2020 was 1.1. He has not been checking his BP at home on a regular basis.     -Dyslipidemia with high LDL: pt counseled on low fat diet. Non fasting lipid panel done in 09/2019 revealed total cholesterol of 322 and LDL of 225.  He was started on atorvastatin 10 mg daily and he is due for repeat lipid panel done in June/2021 revealed improvement of LDL cholesterol at 165 and improvement of total cholesterol at 245 with HDL of 50.  LFTs were normal then and he was advised to continue current medication regimen as previously directed.Marland Kitchen He was advised on low fat diet and exercise.     -Obesity : A screening TSH was normal in 09/2019. He is advised on caloric restriction to promote weight loss. His exercise is limited due to DJD history. His weight remains stable..     -DJD: pt gets along well. He uses a can for assistance in walking. He take OTC aleve prn since tylneol does not work.  He knows not to use NSAID's on a regular basis. .         Return in about 4 months (around 09/11/2020) for Recheck and sched AWV with Lauren in the interim.    Subjective:     HPI  The patient is seen today for routine follow-up visit.  He was last seen in June/2021 with the following HPI:    Prostate cancer with Bony metastasis  Dyslipidemia  History of hypertension  Obesity  Osteoarthritis    He was advised then as follows:  -History of prostate cancer with bone mets: he is followed by Oncology every 3 months. He will continue with her current medication regimen which includes ADT plus apalutamide. He was seen by his oncologist on 02/2020 .  His most recent PSA from 02/2020 was improved at 2.8.   He was advised to continue calcium and vitamin D supplementation.    -History of HTN: pt is currently off HTN medications and he reports his home BP readings remain normal. I encourage him to continue low salt intake and home BP monitoring to ensure normal readings. He had normal creatinine level at 1.2,  on 12/25/2019. Repeat creatinine level in 02/2020 was 1.1. He has not been checking his BP at home on a regular basis.     -Dyslipidemia with high LDL: pt counseled on low fat diet. Non fasting lipid panel done in 09/2019 revealed total cholesterol of 322 and LDL of 225.  He was started on atorvastatin 10 mg daily and he is due for repeat lipid panel done in June/2021 revealed improvement of LDL cholesterol at 165 and improvement of total cholesterol at 245 with HDL of 50.  LFTs were normal then and he was advised to continue current medication regimen as previously directed.Marland Kitchen He was advised on low fat diet and exercise.     -Obesity : A screening TSH was normal in 09/2019. He is advised on caloric restriction to promote weight loss. His exercise is limited due to DJD history. His weight remains stable..     -DJD: pt gets along well. He uses a can for assistance in walking. He take OTC aleve prn since tylneol does not work.  He knows not to use NSAID's on a regular basis. .       ROS  Constitutional:  Denies  unexpected weight loss or gain, or weakness   Eyes:  Denies visual changes  Respiratory:  Denies cough or shortness of breath. No change in exercise  tolerance  Cardiovascular:  Denies chest pain, palpitations or lower extremity swelling   GI:  Denies abdominal pain, diarrhea, constipation   Musculoskeletal:  Denies myalgias  Skin:  Denies nonhealing lesions  Neurologic:  Denies headache, focal weakness or numbness, tingling  Endocrine:  Denies polyuria or polydypsia   Psychiatric:  Denies depression, anxiety      Outpatient Medications Prior to Visit   Medication Sig Dispense Refill   ??? acetaminophen (TYLENOL) 500 MG tablet Take 500 mg by mouth every six (6) hours as needed for pain.      ??? apalutamide (ERLEADA) 60 mg tablet Take 4 tablets (240 mg total) by mouth daily. Swallow tablets whole. 120 tablet 11   ??? atorvastatin (LIPITOR) 10 MG tablet Take 1 tablet (10 mg total) by mouth every evening. 90 tablet 1   ??? calcium carbonate (CALCIUM 600) 600 mg calcium (1,500 mg) tablet Take 1 tablet by mouth daily.     ??? calcium carbonate-vitamin D3 500 mg(1,250mg ) -400 unit per tablet Chew.     ??? cholecalciferol, vitamin D3, (VITAMIN D3) 50 mcg (2,000 unit) cap Take by mouth.     ??? naproxen sodium (ALEVE) 220 MG tablet Take 220 mg by mouth two (2) times a day as needed for pain.       No facility-administered medications prior to visit.         Objective:       Vital Signs  BP 124/70  - Pulse 87  - Temp 36.6 ??C (97.9 ??F) (Oral)  - Resp 18  - Ht 188 cm (6' 2)  - Wt (!) 135.3 kg (298 lb 4.8 oz)  - SpO2 98%  - BMI 38.30 kg/m??      Exam  General: normal appearance  EYES: Anicteric sclerae.  ENT: Oropharynx moist.  RESP: Relaxed respiratory effort. Clear to auscultation without wheezes or crackles.   CV: Regular rate and rhythm. Normal S1 and S2. No murmurs or gallops.  No lower extremity edema. Posterior tibial pulses are 2+ and symmetric.  abd exam: non tender, no masses, no HSM   MSK: No focal muscle tenderness.  SKIN: Appropriately warm and  moist.  NEURO: Stable gait and coordination.    Allergies:     Patient has no known allergies.    Current Medications:     Current Outpatient Medications   Medication Sig Dispense Refill   ??? acetaminophen (TYLENOL) 500 MG tablet Take 500 mg by mouth every six (6) hours as needed for pain.      ??? apalutamide (ERLEADA) 60 mg tablet Take 4 tablets (240 mg total) by mouth daily. Swallow tablets whole. 120 tablet 11   ??? atorvastatin (LIPITOR) 10 MG tablet Take 1 tablet (10 mg total) by mouth every evening. 90 tablet 1   ??? calcium carbonate (CALCIUM 600) 600 mg calcium (1,500 mg) tablet Take 1 tablet by mouth daily.     ??? calcium carbonate-vitamin D3 500 mg(1,250mg ) -400 unit per tablet Chew.     ??? cholecalciferol, vitamin D3, (VITAMIN D3) 50 mcg (2,000 unit) cap Take by mouth.     ??? naproxen sodium (ALEVE) 220 MG tablet Take 220 mg by mouth two (2) times a day as needed for pain.       No current facility-administered medications for this visit.           Note - This record has been created using [employer]. Chart creation errors have been sought, but may not always have been located. Such creation errors do not reflect on the standard of medical care.    Jenell Milliner, MD

## 2020-05-05 NOTE — Unmapped (Signed)
Abstraction Result Flowsheet Data    This patient's last AWV date: [facility] Last Medicare Wellness Visit Date: Not Found  This patients last WCC/CPE date: : Not Found      Reason for Encounter  Reason for Encounter: Outreach  Primary Reason for Call: AWV  Outreach Call Outcome: Left message  Text Message: No

## 2020-05-10 NOTE — Unmapped (Signed)
Abstraction Result Flowsheet Data    This patient's last AWV date: [facility] Last Medicare Wellness Visit Date: Not Found  This patients last WCC/CPE date: : Not Found      Reason for Encounter  Reason for Encounter: Outreach  Primary Reason for Call: AWV  Outreach Call Outcome: No voicemail available  Text Message: No

## 2020-05-11 ENCOUNTER — Ambulatory Visit: Admit: 2020-05-11 | Discharge: 2020-05-12 | Payer: MEDICARE

## 2020-05-11 DIAGNOSIS — C61 Malignant neoplasm of prostate: Principal | ICD-10-CM

## 2020-05-11 DIAGNOSIS — E785 Hyperlipidemia, unspecified: Principal | ICD-10-CM

## 2020-05-11 DIAGNOSIS — M1990 Unspecified osteoarthritis, unspecified site: Principal | ICD-10-CM

## 2020-05-11 DIAGNOSIS — E669 Obesity, unspecified: Principal | ICD-10-CM

## 2020-05-11 DIAGNOSIS — Z1211 Encounter for screening for malignant neoplasm of colon: Principal | ICD-10-CM

## 2020-05-11 DIAGNOSIS — C7951 Secondary malignant neoplasm of bone: Principal | ICD-10-CM

## 2020-05-11 DIAGNOSIS — Z8679 Personal history of other diseases of the circulatory system: Principal | ICD-10-CM

## 2020-05-13 ENCOUNTER — Other Ambulatory Visit: Admit: 2020-05-13 | Discharge: 2020-05-14 | Payer: MEDICARE

## 2020-05-13 DIAGNOSIS — Z1211 Encounter for screening for malignant neoplasm of colon: Principal | ICD-10-CM

## 2020-05-18 NOTE — Unmapped (Signed)
Negative FIT result

## 2020-05-25 NOTE — Unmapped (Signed)
[facility] Clinical Assessment & Refill Coordination Note    Jeffery Tyler, DOB: [birth year]  Phone: [phone] (home)     All above HIPAA information was verified with patient.     Was a Nurse, learning disability used for this call? No    Specialty Medication(s):   Hematology/Oncology: [name]     Current Outpatient Medications   Medication Sig Dispense Refill   ??? acetaminophen (TYLENOL) 500 MG tablet Take 500 mg by mouth every six (6) hours as needed for pain.      ??? apalutamide (ERLEADA) 60 mg tablet Take 4 tablets (240 mg total) by mouth daily. Swallow tablets whole. 120 tablet 11   ??? atorvastatin (LIPITOR) 10 MG tablet Take 1 tablet (10 mg total) by mouth every evening. 90 tablet 1   ??? calcium carbonate (CALCIUM 600) 600 mg calcium (1,500 mg) tablet Take 1 tablet by mouth daily.     ??? calcium carbonate-vitamin D3 500 mg(1,250mg ) -400 unit per tablet Chew.     ??? cholecalciferol, vitamin D3, (VITAMIN D3) 50 mcg (2,000 unit) cap Take by mouth.     ??? naproxen sodium (ALEVE) 220 MG tablet Take 220 mg by mouth two (2) times a day as needed for pain.       No current facility-administered medications for this visit.        Changes to medications: Wendle reports no changes at this time.    No Known Allergies    Changes to allergies: No    SPECIALTY MEDICATION ADHERENCE     Erleada 60 mg: unsure how many days of medicine on hand     Medication Adherence    Patient reported X missed doses in the last month: 0  Specialty Medication: Erleada 60 mg - 4 tabs daily  Patient is on additional specialty medications: No  Informant: patient  Confirmed plan for next specialty medication refill: delivery by pharmacy          Specialty medication(s) dose(s) confirmed: Regimen is correct and unchanged.     Are there any concerns with adherence? No    Adherence counseling provided? Not needed    CLINICAL MANAGEMENT AND INTERVENTION      Clinical Benefit Assessment:    Do you feel the medicine is effective or helping your condition? Yes    Clinical Benefit counseling provided? Not needed    Adverse Effects Assessment:    Are you experiencing any side effects? No    Are you experiencing difficulty administering your medicine? No    Quality of Life Assessment:    How many days over the past month did your prostate cancer  keep you from your normal activities? For example, brushing your teeth or getting up in the morning. 0    Have you discussed this with your provider? Not needed    Therapy Appropriateness:    Is therapy appropriate? Yes, therapy is appropriate and should be continued    DISEASE/MEDICATION-SPECIFIC INFORMATION      N/A    PATIENT SPECIFIC NEEDS     - Does the patient have any physical, cognitive, or cultural barriers? No    - Is the patient high risk? Yes, patient is taking oral chemotherapy. Appropriateness of therapy as been assessed    - Does the patient require a Care Management Plan? No     - Does the patient require physician intervention or other additional services (i.e. nutrition, smoking cessation, social work)? No      SHIPPING  Specialty Medication(s) to be Shipped:   Hematology/Oncology: [name]    Other medication(s) to be shipped: No additional medications requested for fill at this time     Changes to insurance: No    Delivery Scheduled: Yes, Expected medication delivery date: 05/31/20.     Medication will be delivered via Same Day Courier to the confirmed prescription address in [facility].    The patient will receive a drug information handout for each medication shipped and additional FDA Medication Guides as required.  Verified that patient has previously received a Conservation officer, historic buildings.    All of the patient's questions and concerns have been addressed.    Breck Coons [facility] Specialty Pharmacist

## 2020-05-31 DIAGNOSIS — C61 Malignant neoplasm of prostate: Principal | ICD-10-CM

## 2020-05-31 MED FILL — ERLEADA 60 MG TABLET: 26 days supply | Qty: 104 | Fill #6 | Status: AC

## 2020-05-31 MED FILL — ERLEADA 60 MG TABLET: ORAL | 26 days supply | Qty: 104 | Fill #6

## 2020-06-16 NOTE — Unmapped (Signed)
[name] Refill Coordination Note    Specialty Medication(s) to be Shipped:   Hematology/Oncology: [name]    Other medication(s) to be shipped: No additional medications requested for fill at this time     Jeffery Tyler, DOB: 08-Oct-1945  Phone: [phone] (home)       All above HIPAA information was verified with patient.     Was a Nurse, learning disability used for this call? No    Completed refill call assessment today to schedule patient's medication shipment from the [facility] [phone]).       Specialty medication(s) and dose(s) confirmed: Regimen is correct and unchanged.   Changes to medications: [name] reports no changes at this time.  Changes to insurance: No  Questions for the pharmacist: No    Confirmed patient received Welcome Packet with first shipment. The patient will receive a drug information handout for each medication shipped and additional FDA Medication Guides as required.       DISEASE/MEDICATION-SPECIFIC INFORMATION        N/A    SPECIALTY MEDICATION ADHERENCE     Medication Adherence    Patient reported X missed doses in the last month: 0  Specialty Medication: Erleada 60mg   Patient is on additional specialty medications: No  Informant: patient                Erleada 60 mg: 13 days of medicine on hand         SHIPPING     Shipping address confirmed in [employer].     Delivery Scheduled: Yes, Expected medication delivery date: 06/28/20.     Medication will be delivered via Same Day Courier to the prescription address in [employer] [place].    Jeffery Tyler M Elisabeth Tyler   [facility] Specialty Technician

## 2020-06-22 DIAGNOSIS — C61 Malignant neoplasm of prostate: Principal | ICD-10-CM

## 2020-06-28 NOTE — Unmapped (Signed)
Jeffery Tyler 's [name] shipment will be delayed as a result of a high copay.     I have reached out to the patient (spoke to his daughter) and communicated the delay. We will wait for a call back from the patient to reschedule the delivery.  We have not confirmed the new delivery date.

## 2020-06-29 ENCOUNTER — Other Ambulatory Visit: Admit: 2020-06-29 | Discharge: 2020-06-29 | Payer: MEDICARE

## 2020-06-29 ENCOUNTER — Ambulatory Visit
Admit: 2020-06-29 | Discharge: 2020-06-29 | Payer: MEDICARE | Attending: Hematology & Oncology | Primary: Hematology & Oncology

## 2020-06-29 ENCOUNTER — Institutional Professional Consult (permissible substitution): Admit: 2020-06-29 | Discharge: 2020-06-29 | Payer: MEDICARE

## 2020-06-29 DIAGNOSIS — Z801 Family history of malignant neoplasm of trachea, bronchus and lung: Principal | ICD-10-CM

## 2020-06-29 DIAGNOSIS — C7951 Secondary malignant neoplasm of bone: Principal | ICD-10-CM

## 2020-06-29 DIAGNOSIS — C7949 Secondary malignant neoplasm of other parts of nervous system: Principal | ICD-10-CM

## 2020-06-29 DIAGNOSIS — E041 Nontoxic single thyroid nodule: Principal | ICD-10-CM

## 2020-06-29 DIAGNOSIS — C787 Secondary malignant neoplasm of liver and intrahepatic bile duct: Principal | ICD-10-CM

## 2020-06-29 DIAGNOSIS — C61 Malignant neoplasm of prostate: Principal | ICD-10-CM

## 2020-06-29 DIAGNOSIS — I10 Essential (primary) hypertension: Principal | ICD-10-CM

## 2020-06-29 DIAGNOSIS — Z8042 Family history of malignant neoplasm of prostate: Principal | ICD-10-CM

## 2020-06-29 DIAGNOSIS — Z87891 Personal history of nicotine dependence: Principal | ICD-10-CM

## 2020-06-29 DIAGNOSIS — N529 Male erectile dysfunction, unspecified: Principal | ICD-10-CM

## 2020-06-29 DIAGNOSIS — Z5111 Encounter for antineoplastic chemotherapy: Principal | ICD-10-CM

## 2020-06-29 DIAGNOSIS — Z800 Family history of malignant neoplasm of digestive organs: Principal | ICD-10-CM

## 2020-06-29 DIAGNOSIS — Z803 Family history of malignant neoplasm of breast: Principal | ICD-10-CM

## 2020-06-29 LAB — CREATININE
CREATININE: 1.23 mg/dL — ABNORMAL HIGH
EGFR CKD-EPI AA MALE: 67 mL/min/{1.73_m2} (ref >=60)
EGFR CKD-EPI NON-AA MALE: 58 mL/min/{1.73_m2} — ABNORMAL LOW (ref >=60)

## 2020-06-29 LAB — PSA: PROSTATE SPECIFIC ANTIGEN: 3.37 ng/mL (ref 0.00–4.00)

## 2020-06-29 MED ORDER — SILDENAFIL (PULMONARY HYPERTENSION) 20 MG TABLET
ORAL_TABLET | ORAL | 11 refills | 0.00000 days | Status: CP
Start: 2020-06-29 — End: 2020-06-29

## 2020-06-29 MED ORDER — SILDENAFIL (PULMONARY HYPERTENSION) 20 MG TABLET: tablet | 11 refills | 0 days | Status: AC

## 2020-06-29 MED ADMIN — leuprolide (6 month) (ELIGARD) injection 45 mg: 45 mg | SUBCUTANEOUS | @ 20:00:00 | Stop: 2020-06-29

## 2020-06-29 NOTE — Unmapped (Signed)
[facility] Visit Note    Patient Name: Jeffery Tyler  Patient Age: 74 y.o.  Encounter Date: 06/29/2020  Attending Provider:  Siddhanth Denk E. Philomena Course, MD  Referring physician: Maurie Boettcher, MD    Assessment  Patient Active Problem List   Diagnosis   ??? Hypertensive emergency   ??? Brain mass   ??? Bony metastasis (CMS-HCC)   ??? 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 Cancer (CMS-HCC)   ??? Hypertension   ??? Hyperglycemia   ??? Elevated serum creatinine   ??? Dyslipidemia   ??? Acute kidney injury (CMS-HCC)   ??? Hyperkalemia   ??? Dehydration   ??? Obesity   ??? DJD (degenerative joint disease)   ??? History of hypertension     1. Metastatic hormone sensitive prostate cancer  2. Bone metastases    A 74 y.o. man with metastatic prostate cancer.  No histologic diagnosis of prostate cancer. On ADT plus apalutamide. PSA 6200 at presentation, decreased in response to treatment.    Pt on ADT plus apalutamide and PSA might be up for the first time? Went from 2.83 to 3.37.  Continue monitoring PSA for progression to CRPC.    Plan  1. Continue ADT, Eligard 45 mg given today on 11/30, due next on 12/27/2020.  2. Continue with apalutamide. Plan confirmed  3. Germline testing negative for pathogenic variants. No additional testing required.  4. Bone support - Continue calcium Vitamin D.  Denosumab will not be continued, since pt's disease is hormone sensitive and the risk of SRE is low. Plan confirmed  5. For ED, a trial of sildenafil, generic.  Printed prescription given and info on pharmacy (like [facility]) given  6. Return in 3 months    I personally spent 40 minutes face-to-face and non-face-to-face in the care of this patient, which includes all pre, intra, and post visit time on the date of service.    Reason for Visit  Follow up of prostate cancer    History of Present Illness:  Oncology History Overview Note   ?? In 09/2018, presented with diffuse bone mets, PSA 6200.  ?? In 10/2018, ADT started with Degarelix, then continued with Eligard  ?? In 10/2018, denosumab started, continued till 10/2019  ?? In 10/2018, apalutamide started     Prostate cancer, primary, with metastasis from prostate to other site (CMS-HCC)   10/15/2018 -  Cancer Staged    MRI Brain:  Enhancing right clival mass, with differential including metastasis, plasmacytoma, much less likely chordoma or chondrosarcoma. CT would be helpful for evaluation of the bone.  Remote hemorrhage in the left cerebellar hemisphere. No acute ischemia or hemorrhage.   Chronic microvascular ischemic changes.     10/16/2018 -  Cancer Staged    CT Head:  Heterogeneous enhancement right clival mass with partial encasement of the right cavernous ICA and osseous destruction of the sphenoid sinus posterior wall. Query a right cavernous ICA aneurysm measuring 0.8 cm versus superior extension of the clival mass.     10/16/2018 -  Cancer Staged    CT maxillofacial: 2.2 cm clival mass.  Mixed sclerotic/lytic appearance of the C1-C5 vertebra is concerning for osseous metastases.     10/17/2018 -  Cancer Staged    CT Chest:  3.9 cm partially calcified left thyroid mass. Further evaluation with dedicated ultrasound is recommended.  0.2 cm right upper lobe subpleural nodule ; indeterminate. Recommend follow up CT in 3 months for  further assessment.??  Expansile soft tissue metastatic lesion involving the right anterolateral fourth rib.  Diffusely sclerotic/lytic appearance of the axial and proximal appendicular skeleton is concerning for extensive osseous metastatic disease.     10/17/2018 -  Cancer Staged    CT AP:  Diffuse osseous metastatic disease with erosive deformities most significantly involving the T10 and L3 vertebral bodies. Significant extension of metastases into the epidural space with narrowing of the cord at the level of the L3 vertebral body. T??  Multifocal hepatic metastases measuring up to 3.8 cm within segment 3.  Indeterminant 1.4 cm upper left renal pole hypodensity. This may be further evaluated with multiphasic CT renal mass protocol or MRI if clinically desired. Alternatively, attention at follow-up is recommended.     10/23/2018 Initial Diagnosis    Prostate cancer, primary, with metastasis from prostate to other site (CMS-HCC): PSA 6200     10/28/2018 -  Cancer Staged    Bone scan:  Diffuse foci of uptake in the appendicular and axial skeleton as above, concerning for extensive osseous metastatic disease.        10/30/2018 Biopsy    Liver, core biopsy  - Minute fragments of liver parenchyma with centrilobular congestion, no tumor identified (see comment)     10/30/2018 - 10/30/2018 Endocrine/Hormone Therapy    OP DEGARELIX  Plan Provider: Towanda Malkin, MD     12/02/2018 - 03/04/2019 Endocrine/Hormone Therapy    OP LEUPROLIDE (22.5 MG EVERY 3 MONTHS)  Plan Provider: Towanda Malkin, MD     03/04/2019 -  Cancer Staged    CT CAP:  Interval slightly increased sclerosis of diffuse osseous metastasis with mild decrease in size of soft tissue mass associated with right fourth rib lesion, suggestive of treatment response.  L thyroid nodule stable.  --No pulmonary parenchymal metastasis    --Stable appearance of hepatic lesions.        06/04/2019 - 09/08/2019 Endocrine/Hormone Therapy    OP LEUPROLIDE (ELIGARD) 22.5 MG EVERY 3 MONTHS  Plan Provider: Towanda Malkin, MD     12/25/2019 - 12/25/2019 Endocrine/Hormone Therapy    OP LEUPROLIDE (ELIGARD) 45 MG EVERY 6 MONTHS  Plan Provider: Maurie Boettcher, MD     06/29/2020 Endocrine/Hormone Therapy    OP PROSTATE LEUPROLIDE (ELIGARD) 45 MG EVERY 6 MONTHS  Plan Provider: Maurie Boettcher, MD         Current therapy  ADT with Eligard  Apalutamide    Interval history    The patient returns for scheduled follow up, unaccompanied today. Pt notes that he has been doing well and has no complaints, other than baseline chronic hot flashes. Pt just ran out of apalutamide yesterday.  Pt notes ED and would like to get some help for ED.  No other issues noted.      Allergies:  No Known Allergies    Current Medications:    Current Outpatient Medications:   ???  acetaminophen (TYLENOL) 500 MG tablet, Take 500 mg by mouth every six (6) hours as needed for pain. , Disp: , Rfl:   ???  apalutamide (ERLEADA) 60 mg tablet, Take 4 tablets (240 mg total) by mouth daily. Swallow tablets whole., Disp: 120 tablet, Rfl: 11  ???  atorvastatin (LIPITOR) 10 MG tablet, Take 1 tablet (10 mg total) by mouth every evening., Disp: 90 tablet, Rfl: 1  ???  calcium carbonate (CALCIUM 600) 600 mg calcium (1,500 mg) tablet, Take 1 tablet by mouth daily., Disp: , Rfl:   ???  calcium carbonate-vitamin D3 500 mg(1,250mg ) -400 unit per tablet, Chew., Disp: , Rfl:   ???  cholecalciferol, vitamin D3, (VITAMIN D3) 50 mcg (2,000 unit) cap, Take by mouth., Disp: , Rfl:   ???  naproxen sodium (ALEVE) 220 MG tablet, Take 220 mg by mouth two (2) times a day as needed for pain., Disp: , Rfl:   ???  sildenafiL, pulm.hypertension, (REVATIO) 20 mg tablet, 2-3 tabs po every day PRN. Up to 5 maximum, Disp: 20 tablet, Rfl: 11    Past Medical History and Social History  Past Medical History:   Diagnosis Date   ??? Cancer (CMS-HCC)     prostate cancer with bony mets   ??? Hypertension       No past surgical history on file.     Social History     Occupational History   ??? Occupation: retired   Tobacco Use   ??? Smoking status: Former Smoker     Packs/day: 1.00     Years: 3.00     Pack years: 3.00     Types: Cigars     Quit date: 05/31/1960     Years since quitting: 60.1   ??? Smokeless tobacco: Former Neurosurgeon     Types: Snuff     Quit date: 05/31/1960   ??? Tobacco comment: 1-2 cigars daily   Vaping Use   ??? Vaping Use: Never used   Substance and Sexual Activity   ??? Alcohol use: No   ??? Drug use: Never   ??? Sexual activity: Not Currently       Family History  Family History   Problem Relation Age of Onset   ??? Leukemia Mother 54   ??? Breast cancer Mother    ??? Lung cancer Father    ??? Leukemia Sister 1   ??? Liver cancer Brother 35   ??? Tuberculosis Maternal Aunt    ??? Tuberculosis Maternal Uncle    ??? Tuberculosis Maternal Grandmother    ??? Tuberculosis Maternal Grandfather    ??? Stomach cancer Sister 1   ??? Prostate cancer Brother 21   ??? Lung cancer Brother 74        smoker   ??? Stomach cancer Brother    ??? Prostate cancer Brother 39   ??? No Known Problems Daughter    ??? No Known Problems Daughter    ??? No Known Problems Daughter    ??? No Known Problems Daughter    ??? No Known Problems Son      Prostate Cancer Family History Assessment:  History of cancer in children (yes/no; if yes, what type AND age of diagnosis): No   History of cancer in siblings (yes/no; if yes, provide relation, type of cancer, AND age of diagnosis): YES, two brothers with prostate cancer   History of cancer in parents (yes/no; if yes, please specify parent, type of cancer, AND age of diagnosis): YES, mother with leukemia, breast cancer, father with lung cancer   History of cancer in aunts/uncles/grandparents (yes/no; if yes, provide relation, type of cancer, AND age of diagnosis): No       Review of Systems:  A comprehensive review of 10 systems was negative except for pertinent positives noted in HPI.    Physical Exam:    VITAL SIGNS:  BP 154/84  - Pulse 84  - Resp 18  - Ht 188 cm (6' 2)  - Wt (!) 137 kg (302 lb)  - SpO2 100%  - BMI 38.77 kg/m??   ECOG Performance  Status: 1  GENERAL: Well-developed, well-nourished patient in no acute distress.  HEAD: Normocephalic and atraumatic.  EYES: Conjunctivae are normal. No scleral icterus.  MOUTH/THROAT: Oropharynx is clear and moist.  No mucosal lesions.  NECK: Supple, no thyromegaly.  LYMPHATICS: No palpable cervical, supraclavicular, or axillary adenopathy.  CHEST: Bilat gynecomastia.  CARDIOVASCULAR: Normal rate, regular rhythm and normal heart sounds.  Exam reveals no gallop and no friction rub.  No murmur heard.  PULMONARY/CHEST: Effort normal and breath sounds normal. No respiratory distress.  ABDOMINAL:  Soft. There is no distension. There is no tenderness. There is no rebound and no guarding.  MUSCULOSKELETAL: No clubbing, cyanosis, or lower extremity edema.  PSYCHIATRIC: Alert and oriented.  Normal mood and affect.  NEUROLOGIC: No focal motor deficit. Normal gait.  SKIN: Skin is warm, dry, and intact.      Results/Orders:    Lab on 06/29/2020   Component Date Value Ref Range Status   ??? PSA 06/29/2020 3.37  0.00 - 4.00 ng/mL Final   ??? Creatinine 06/29/2020 1.23* 0.60 - 1.10 mg/dL Final   ??? EGFR CKD-EPI Non-African American,* 06/29/2020 58* >=60 mL/min/1.40m2 Final   ??? EGFR CKD-EPI African American, Male 06/29/2020 67  >=60 mL/min/1.26m2 Final       Lab Results   Component Value Date    PSA 3.37 06/29/2020    PSA 2.83 03/18/2020    PSA 3.83 01/12/2020    PSA 4.14 (H) 12/25/2019    PSA 5.64 (H) 11/11/2019    PSA 8.13 (H) 07/14/2019    PSA 12.80 (H) 05/12/2019    PSA 29.10 (H) 04/09/2019    PSA 17.50 (H) 03/04/2019    PSA 41.70 (H) 02/05/2019             Orders placed or performed in visit on 06/29/20   ??? Clinic Appointment Request Physician, Lab, Injection   ??? Clinic Appointment Request Physician, Lab   ??? LAB Appointment Request     Molecular Pathology  Germline testing [facility] prostate panel 11/11/2019  Negative for pathogenic variants    Pathology  No prostate path available      Imaging results:  Bone scan 10/28/2018     Diffuse foci of uptake in the appendicular and axial skeleton as above, concerning for extensive osseous metastatic disease.    CT CAP 10/17/2018  3.9 cm partially calcified left thyroid mass. Further evaluation with dedicated ultrasound is recommended.  ??  0.2 cm right upper lobe subpleural nodule ; indeterminate. Recommend follow up CT in 3 months for further assessment.  ??  Expansile soft tissue metastatic lesion involving the right anterolateral fourth rib.  Diffusely sclerotic/lytic appearance of the axial and proximal appendicular skeleton is concerning for extensive osseous metastatic disease.    Diffuse osseous metastatic disease with erosive deformities most significantly involving the T10 and L3 vertebral bodies. Significant extension of metastases into the epidural space with narrowing of the cord at the level of the L3 vertebral body. This was discussed with the neurosurgery ICU at 11:00 AM on 10/17/2018.  ??  Multifocal hepatic metastases measuring up to 3.8 cm within segment 3.  ??  Indeterminant 1.4 cm upper left renal pole hypodensity. This may be further evaluated with multiphasic CT renal mass protocol or MRI if clinically desired. Alternatively, attention at follow-up is recommended.

## 2020-06-29 NOTE — Unmapped (Signed)
Pt tolerated Eligard injection without difficulty. [facility] left [facility] Daughter escorting, NAD, no questions, complaints, nor concerns voiced at d/c.

## 2020-06-29 NOTE — Unmapped (Addendum)
Lab Results   Component Value Date    PSA 3.37 06/29/2020    PSA 2.83 03/18/2020    PSA 3.83 01/12/2020    PSA 4.14 (H) 12/25/2019    PSA 5.64 (H) 11/11/2019    PSA 8.13 (H) 07/14/2019     Please call [phone] to reach my nurse navigator Mauricia Area for any issues.    For emergencies on Nights, Weekends and Holidays  Call [phone] and ask for the hematology/oncology on call.    Griffin Basil, MD, PhD  Associate Professor of Medicine  Division of Hematology-Oncology    [facility]  Genitourinary Oncology [facility]  Nurse Navigator: Mauricia Area  Fax: [phone]

## 2020-07-08 NOTE — Unmapped (Signed)
Jeffery Tyler 's Jeffery Tyler shipment will be canceled  as a result of a high copay. Patient has been approved for [employer] assistance.    [employer] has reached out to the patient and communicated the delivery change. We will not reschedule the medication and have removed this/these medication(s) from the work request.  We have canceled this work request.

## 2020-07-10 NOTE — Unmapped (Signed)
This patient has been disenrolled from the [facility] specialty pharmacy services due to enrollment in a manufacturer assistance program that sends medicine directly to the patient. (Erleada/apalutamide)    Kermit Balo  [facility] Specialty Pharmacist

## 2020-07-15 DIAGNOSIS — C61 Malignant neoplasm of prostate: Principal | ICD-10-CM

## 2020-09-16 DIAGNOSIS — C61 Malignant neoplasm of prostate: Principal | ICD-10-CM

## 2020-09-16 MED ORDER — ERLEADA 60 MG TABLET
ORAL_TABLET | Freq: Every day | ORAL | 11 refills | 30 days
Start: 2020-09-16 — End: ?

## 2020-09-17 DIAGNOSIS — C61 Malignant neoplasm of prostate: Principal | ICD-10-CM

## 2020-09-17 MED ORDER — ERLEADA 60 MG TABLET
ORAL_TABLET | Freq: Every day | ORAL | 11 refills | 30.00000 days | Status: CP
Start: 2020-09-17 — End: ?

## 2020-09-20 DIAGNOSIS — C61 Malignant neoplasm of prostate: Principal | ICD-10-CM

## 2020-09-20 MED ORDER — ATORVASTATIN 10 MG TABLET
ORAL_TABLET | 1 refills | 0 days | Status: CP
Start: 2020-09-20 — End: ?

## 2020-09-22 DIAGNOSIS — C61 Malignant neoplasm of prostate: Principal | ICD-10-CM

## 2020-09-22 MED ORDER — ERLEADA 60 MG TABLET
ORAL_TABLET | Freq: Every day | ORAL | 11 refills | 30 days | Status: CP
Start: 2020-09-22 — End: ?

## 2020-10-12 ENCOUNTER — Other Ambulatory Visit: Admit: 2020-10-12 | Discharge: 2020-10-13 | Payer: MEDICARE

## 2020-10-12 ENCOUNTER — Ambulatory Visit
Admit: 2020-10-12 | Discharge: 2020-10-13 | Payer: MEDICARE | Attending: Hematology & Oncology | Primary: Hematology & Oncology

## 2020-10-12 DIAGNOSIS — C61 Malignant neoplasm of prostate: Principal | ICD-10-CM

## 2020-12-02 ENCOUNTER — Ambulatory Visit: Admit: 2020-12-02 | Discharge: 2020-12-02 | Payer: MEDICARE

## 2020-12-02 DIAGNOSIS — Z8679 Personal history of other diseases of the circulatory system: Principal | ICD-10-CM

## 2020-12-02 DIAGNOSIS — C61 Malignant neoplasm of prostate: Principal | ICD-10-CM

## 2020-12-02 DIAGNOSIS — M1990 Unspecified osteoarthritis, unspecified site: Principal | ICD-10-CM

## 2020-12-02 DIAGNOSIS — Z0000 Encounter for general adult medical examination without abnormal findings: Principal | ICD-10-CM

## 2020-12-02 DIAGNOSIS — E669 Obesity, unspecified: Principal | ICD-10-CM

## 2020-12-02 DIAGNOSIS — Z7189 Other specified counseling: Principal | ICD-10-CM

## 2020-12-02 DIAGNOSIS — R7989 Other specified abnormal findings of blood chemistry: Principal | ICD-10-CM

## 2020-12-02 DIAGNOSIS — E785 Hyperlipidemia, unspecified: Principal | ICD-10-CM

## 2020-12-13 ENCOUNTER — Ambulatory Visit
Admit: 2020-12-13 | Discharge: 2020-12-14 | Disposition: A | Discharge status: Home / Self Care | Payer: MEDICARE | Attending: Student in an Organized Health Care Education/Training Program

## 2020-12-13 ENCOUNTER — Emergency Department
Admit: 2020-12-13 | Discharge: 2020-12-14 | Disposition: A | Discharge status: Home / Self Care | Payer: MEDICARE | Attending: Student in an Organized Health Care Education/Training Program

## 2020-12-22 ENCOUNTER — Ambulatory Visit: Admit: 2020-12-22 | Discharge: 2020-12-23 | Payer: MEDICARE

## 2021-01-11 ENCOUNTER — Ambulatory Visit: Admit: 2021-01-11 | Discharge: 2021-02-09 | Payer: MEDICARE

## 2021-01-13 ENCOUNTER — Institutional Professional Consult (permissible substitution): Admit: 2021-01-13 | Discharge: 2021-01-13 | Payer: MEDICARE

## 2021-01-13 ENCOUNTER — Ambulatory Visit
Admit: 2021-01-13 | Discharge: 2021-01-13 | Payer: MEDICARE | Attending: Hematology & Oncology | Primary: Hematology & Oncology

## 2021-01-13 ENCOUNTER — Other Ambulatory Visit: Admit: 2021-01-13 | Discharge: 2021-01-13 | Payer: MEDICARE

## 2021-01-13 DIAGNOSIS — C61 Malignant neoplasm of prostate: Principal | ICD-10-CM

## 2021-03-30 MED ORDER — ATORVASTATIN 10 MG TABLET
ORAL_TABLET | Freq: Every evening | ORAL | 1 refills | 90 days | Status: CP
Start: 2021-03-30 — End: 2022-03-30

## 2021-05-05 ENCOUNTER — Other Ambulatory Visit: Admit: 2021-05-05 | Discharge: 2021-05-06 | Payer: MEDICARE

## 2021-05-05 ENCOUNTER — Ambulatory Visit
Admit: 2021-05-05 | Discharge: 2021-05-06 | Payer: MEDICARE | Attending: Hematology & Oncology | Primary: Hematology & Oncology

## 2021-05-05 DIAGNOSIS — C61 Malignant neoplasm of prostate: Principal | ICD-10-CM

## 2021-05-31 ENCOUNTER — Ambulatory Visit: Admit: 2021-05-31 | Discharge: 2021-06-01 | Payer: MEDICARE

## 2021-05-31 DIAGNOSIS — M1990 Unspecified osteoarthritis, unspecified site: Principal | ICD-10-CM

## 2021-05-31 DIAGNOSIS — Z1211 Encounter for screening for malignant neoplasm of colon: Principal | ICD-10-CM

## 2021-05-31 DIAGNOSIS — R7989 Other specified abnormal findings of blood chemistry: Principal | ICD-10-CM

## 2021-05-31 DIAGNOSIS — E669 Obesity, unspecified: Principal | ICD-10-CM

## 2021-05-31 DIAGNOSIS — C61 Malignant neoplasm of prostate: Principal | ICD-10-CM

## 2021-05-31 DIAGNOSIS — R635 Abnormal weight gain: Principal | ICD-10-CM

## 2021-05-31 DIAGNOSIS — Z8679 Personal history of other diseases of the circulatory system: Principal | ICD-10-CM

## 2021-05-31 DIAGNOSIS — E785 Hyperlipidemia, unspecified: Principal | ICD-10-CM

## 2021-06-02 ENCOUNTER — Other Ambulatory Visit: Admit: 2021-06-02 | Discharge: 2021-06-03 | Payer: MEDICARE

## 2021-06-02 DIAGNOSIS — Z1211 Encounter for screening for malignant neoplasm of colon: Principal | ICD-10-CM

## 2021-06-09 DIAGNOSIS — C61 Malignant neoplasm of prostate: Principal | ICD-10-CM

## 2021-07-12 DIAGNOSIS — C61 Malignant neoplasm of prostate: Principal | ICD-10-CM

## 2021-07-21 DIAGNOSIS — C61 Malignant neoplasm of prostate: Principal | ICD-10-CM

## 2021-07-21 MED ORDER — ERLEADA 60 MG TABLET
ORAL_TABLET | Freq: Every day | ORAL | 11 refills | 30 days | Status: CP
Start: 2021-07-21 — End: ?

## 2021-08-09 ENCOUNTER — Ambulatory Visit
Admit: 2021-08-09 | Discharge: 2021-08-09 | Payer: MEDICARE | Attending: Hematology & Oncology | Primary: Hematology & Oncology

## 2021-08-09 ENCOUNTER — Other Ambulatory Visit: Admit: 2021-08-09 | Discharge: 2021-08-09 | Payer: MEDICARE

## 2021-08-09 ENCOUNTER — Institutional Professional Consult (permissible substitution): Admit: 2021-08-09 | Discharge: 2021-08-09 | Payer: MEDICARE

## 2021-08-09 DIAGNOSIS — C61 Malignant neoplasm of prostate: Principal | ICD-10-CM

## 2021-08-09 DIAGNOSIS — C7951 Secondary malignant neoplasm of bone: Principal | ICD-10-CM

## 2021-08-09 NOTE — Unmapped (Signed)
[facility] Visit Note    Patient Name: Jeffery Tyler  Patient Age: 76 y.o.  Encounter Date: 08/09/2021  Attending Provider:  Humna Moorehouse E. Philomena Course, MD  Referring physician: Maurie Boettcher, MD    Assessment  Patient Active Problem List   Diagnosis   ??? Hypertensive emergency   ??? Brain mass   ??? 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 Cancer (CMS-HCC)   ??? Hypertension   ??? Hyperglycemia   ??? Elevated serum creatinine   ??? Dyslipidemia   ??? Acute kidney injury (CMS-HCC)   ??? Hyperkalemia   ??? Dehydration   ??? Obesity   ??? History of hypertension   ??? Arthritis     1. Metastatic castration resistant prostate cancer  2. Bone metastases    A 76 y.o. man with metastatic prostate cancer.  No histologic diagnosis of prostate cancer. On ADT plus apalutamide. PSA 6200 at presentation, decreased in response to treatment.    In 09/2020, Pt on ADT plus apalutamide and PSA went from 2.83 to 3.37 to 3.88.  This second consecutive PSA rise confirms progression to CRPC. However, pt is asymptomatic and PSA rise is slow, so no need to consider immediate change to another therapy (eg cytotoxic chemo).  Will get staging scans and in this setting, apalutamide would be continued until radiographic progression.    In 12/2020, PSA down to 2.7, scans stable. Pt without cancer-related symptoms. Therefore, continue with current therapy of ADT plus apalutamide.    In 04/2021, pt stable clinically, without cancer-related symptoms. PSA stable (2.8). Continue current therapy.    Today, in 74/2023, pt doing well clinically, PSA stably low (or even decreasing to 1.63) on apalutamide.  However, access to apalutamide needs to be addressed.    Plan  1. Continue ADT, Eligard 45 mg given today on 08/09/2021, due next on 02/06/2022.  2. Continue with apalutamide.  -- Pharmacy and MAP coordinator messaged about helping pt get apalutamide  3. Staging CT and bone scan in 12/2020 shows stable disease. Consider repeating every 6-12 months  4. Germline testing negative for pathogenic variants. No additional testing required.  5. Bone support - Continue calcium Vitamin D. Plan confirmed. (At progression of disease, denosumab/Zometa to be considered)  6. Return in 3 months    I personally spent 40 minutes face-to-face and non-face-to-face in the care of this patient, which includes all pre, intra, and post visit time on the date of service.    Reason for Visit  Follow up of prostate cancer    History of Present Illness:  Oncology History Overview Note   ?? In 09/2018, presented with diffuse bone mets, PSA 6200.  ?? In 10/2018, ADT started with Degarelix, then continued with Eligard  ?? In 10/2018, denosumab started, continued till 10/2019  ?? In 10/2018, apalutamide started  ?? In 02/2020, PSA 2.8, nadir  ?? In 09/2020, second PSA rise to 3.9, progressed to castration resistant disease. Apalutamide continued.  ?? In 12/2020, PSA decreased to 2.7, scans stable. Apalutamide continued.     Prostate cancer, primary, with metastasis from prostate to other site (CMS-HCC)   10/15/2018 -  Cancer Staged    MRI Brain:  Enhancing right clival mass, with differential including metastasis, plasmacytoma, much less likely chordoma or chondrosarcoma. CT would be helpful for evaluation of the bone.  Remote hemorrhage in the left cerebellar hemisphere. No acute ischemia or hemorrhage.   Chronic microvascular ischemic changes.  10/16/2018 -  Cancer Staged    CT Head:  Heterogeneous enhancement right clival mass with partial encasement of the right cavernous ICA and osseous destruction of the sphenoid sinus posterior wall. Query a right cavernous ICA aneurysm measuring 0.8 cm versus superior extension of the clival mass.     10/16/2018 -  Cancer Staged    CT maxillofacial: 2.2 cm clival mass.  Mixed sclerotic/lytic appearance of the C1-C5 vertebra is concerning for osseous metastases.     10/17/2018 -  Cancer Staged    CT Chest:  3.9 cm partially calcified left thyroid mass. Further evaluation with dedicated ultrasound is recommended.  0.2 cm right upper lobe subpleural nodule ; indeterminate. Recommend follow up CT in 3 months for further assessment.??  Expansile soft tissue metastatic lesion involving the right anterolateral fourth rib.  Diffusely sclerotic/lytic appearance of the axial and proximal appendicular skeleton is concerning for extensive osseous metastatic disease.     10/17/2018 -  Cancer Staged    CT AP:  Diffuse osseous metastatic disease with erosive deformities most significantly involving the T10 and L3 vertebral bodies. Significant extension of metastases into the epidural space with narrowing of the cord at the level of the L3 vertebral body. T??  Multifocal hepatic metastases measuring up to 3.8 cm within segment 3.  Indeterminant 1.4 cm upper left renal pole hypodensity. This may be further evaluated with multiphasic CT renal mass protocol or MRI if clinically desired. Alternatively, attention at follow-up is recommended.     10/23/2018 Initial Diagnosis    Prostate cancer, primary, with metastasis from prostate to other site (CMS-HCC): PSA 6200     10/23/2018 -  Cancer Staged    Staging form: Prostate, AJCC 8th Edition  - Clinical: Stage IVB (cT4, cN0, cM1b, PSA: 6200) - Signed by Maurie Boettcher, MD on 01/14/2021       10/28/2018 -  Cancer Staged    Bone scan:  Diffuse foci of uptake in the appendicular and axial skeleton as above, concerning for extensive osseous metastatic disease.        10/30/2018 Biopsy    Liver, core biopsy  - Minute fragments of liver parenchyma with centrilobular congestion, no tumor identified (see comment)     10/30/2018 - 10/30/2018 Endocrine/Hormone Therapy    OP DEGARELIX  Plan Provider: Towanda Malkin, MD     12/02/2018 - 03/04/2019 Endocrine/Hormone Therapy    OP LEUPROLIDE (22.5 MG EVERY 3 MONTHS)  Plan Provider: Towanda Malkin, MD     03/04/2019 -  Cancer Staged    CT CAP:  Interval slightly increased sclerosis of diffuse osseous metastasis with mild decrease in size of soft tissue mass associated with right fourth rib lesion, suggestive of treatment response.  L thyroid nodule stable.  --No pulmonary parenchymal metastasis    --Stable appearance of hepatic lesions.        06/04/2019 - 09/08/2019 Endocrine/Hormone Therapy    OP LEUPROLIDE (ELIGARD) 22.5 MG EVERY 3 MONTHS  Plan Provider: Towanda Malkin, MD     12/25/2019 - 12/25/2019 Endocrine/Hormone Therapy    OP LEUPROLIDE (ELIGARD) 45 MG EVERY 6 MONTHS  Plan Provider: Maurie Boettcher, MD     06/29/2020 Endocrine/Hormone Therapy    OP PROSTATE LEUPROLIDE (ELIGARD) 45 MG EVERY 6 MONTHS  Plan Provider: Maurie Boettcher, MD         Current therapy  ADT with Eligard  Apalutamide    Interval history    The patient returns for scheduled follow  up. Pt notes some shoulder pain. Pt notes swelling of feet after working around the house or grocery shopping. Pt notes about 4 hot flashes a day. Pt felt so good that he feels like his cancer is gone.    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 He did just receive a new bottle of Erleada.      Allergies:  No Known Allergies    Current Medications:    Current Outpatient Medications:   ???  acetaminophen (TYLENOL) 500 MG tablet, Take 500 mg by mouth every six (6) hours as needed for pain. , Disp: , Rfl:   ???  apalutamide (ERLEADA) 60 mg tablet, Take 4 tablets (240 mg total) by mouth daily. Swallow tablets whole., Disp: 120 tablet, Rfl: 11  ???  atorvastatin (LIPITOR) 10 MG tablet, Take 1 tablet (10 mg total) by mouth every evening., Disp: 90 tablet, Rfl: 1  ???  calcium carbonate (OS-CAL) 1,500 mg (600 mg elem calcium) tablet, Take 1 tablet by mouth daily., Disp: , Rfl:   ???  cholecalciferol, vitamin D3-50 mcg, 2,000 unit,, 50 mcg (2,000 unit) cap, Take by mouth., Disp: , Rfl:   ???  naproxen sodium (ALEVE) 220 MG tablet, Take 220 mg by mouth two (2) times a day as needed for pain., Disp: , Rfl:     Past Medical History and Social History  Past Medical History:   Diagnosis Date   ??? Cancer (CMS-HCC)     prostate cancer with bony mets   ??? Hypertension       No past surgical history on file.     Social History     Occupational History   ??? Occupation: retired   Tobacco Use   ??? Smoking status: Former     Packs/day: 1.00     Years: 3.00     Pack years: 3.00     Types: Cigars, Cigarettes     Quit date: 05/31/1960     Years since quitting: 61.2   ??? Smokeless tobacco: Former     Types: Snuff     Quit date: 05/31/1960   ??? Tobacco comments:     1-2 cigars daily   Vaping Use   ??? Vaping Use: Never used   Substance and Sexual Activity   ??? Alcohol use: No   ??? Drug use: Never   ??? Sexual activity: Not Currently       Family History  Family History   Problem Relation Age of Onset   ??? Leukemia Mother 16   ??? Breast cancer Mother    ??? Lung cancer Father    ??? Leukemia Sister 47   ??? Liver cancer Brother 52   ??? Tuberculosis Maternal Aunt    ??? Tuberculosis Maternal Uncle    ??? Tuberculosis Maternal Grandmother    ??? Tuberculosis Maternal Grandfather    ??? Stomach cancer Sister 34   ??? Prostate cancer Brother 48   ??? Lung cancer Brother 28        smoker   ??? Stomach cancer Brother    ??? Prostate cancer Brother 14   ??? No Known Problems Daughter    ??? No Known Problems Daughter    ??? No Known Problems Daughter    ??? No Known Problems Daughter    ??? No Known Problems Son      Prostate Cancer Family History Assessment:  History of cancer in children (  yes/no; if yes, what type AND age of diagnosis): No   History of cancer in siblings (yes/no; if yes, provide relation, type of cancer, AND age of diagnosis): YES, two brothers with prostate cancer   History of cancer in parents (yes/no; if yes, please specify parent, type of cancer, AND age of diagnosis): YES, mother with leukemia, breast cancer, father with lung cancer   History of cancer in aunts/uncles/grandparents (yes/no; if yes, provide relation, type of cancer, AND age of diagnosis): No       Review of Systems:  A comprehensive review of 10 systems was negative except for pertinent positives noted in HPI.    Physical Exam:    VITAL SIGNS:  There were no vitals taken for this visit.  ECOG Performance Status: 1  GENERAL: Well-developed, well-nourished patient in no acute distress.  HEAD: Normocephalic and atraumatic.  EYES: Conjunctivae are normal. No scleral icterus.  MOUTH/THROAT: Oropharynx is clear and moist.  No mucosal lesions.  NECK: Supple, no thyromegaly.  LYMPHATICS: No palpable cervical, supraclavicular, or axillary adenopathy.  CHEST: Bilat gynecomastia.  CARDIOVASCULAR: Normal rate, regular rhythm and normal heart sounds.  Exam reveals no gallop and no friction rub.  No murmur heard.  PULMONARY/CHEST: Effort normal and breath sounds normal. No respiratory distress.  ABDOMINAL:  Soft. There is no distension. There is no tenderness. There is no rebound and no guarding.  MUSCULOSKELETAL: No clubbing, cyanosis, or lower extremity edema.  PSYCHIATRIC: Alert and oriented.  Normal mood and affect.  NEUROLOGIC: No focal motor deficit. Normal gait.  SKIN: Skin is warm, dry, and intact.      Results/Orders:    Lab on 08/09/2021   Component Date Value Ref Range Status   ??? Sodium 08/09/2021 144  135 - 145 mmol/L Final   ??? Potassium 08/09/2021 3.8  3.5 - 5.1 mmol/L Final   ??? Chloride 08/09/2021 106  98 - 107 mmol/L Final   ??? CO2 08/09/2021 27.0  20.0 - 31.0 mmol/L Final   ??? Anion Gap 08/09/2021 11  5 - 14 mmol/L Final   ??? BUN 08/09/2021 19  9 - 23 mg/dL Final   ??? Creatinine 08/09/2021 1.09  0.60 - 1.10 mg/dL Final   ??? BUN/Creatinine Ratio 08/09/2021 17   Final   ??? eGFR CKD-EPI (2021) Male 08/09/2021 72  >=60 mL/min/1.33m2 Final    eGFR calculated with CKD-EPI 2021 equation in accordance with [employer] and [employer] of Nephrology Task Force recommendations.   ??? Glucose 08/09/2021 139  70 - 179 mg/dL Final   ??? Calcium 74/25/9563 9.4  8.7 - 10.4 mg/dL Final   ??? Albumin 87/56/4332 3.4  3.4 - 5.0 g/dL Final   ??? Total Protein 08/09/2021 6.2  5.7 - 8.2 g/dL Final   ??? Total Bilirubin 08/09/2021 0.3  0.3 - 1.2 mg/dL Final   ??? AST 95/18/8416 14  <=34 U/L Final   ??? ALT 08/09/2021 9 (L)  10 - 49 U/L Final   ??? Alkaline Phosphatase 08/09/2021 157 (H)  46 - 116 U/L Final   ??? PSA 08/09/2021 1.63  0.00 - 4.00 ng/mL Final   ??? Phosphorus 08/09/2021 3.6  2.4 - 5.1 mg/dL Final   ??? WBC 60/63/0160 4.8  3.6 - 11.2 10*9/L Final   ??? RBC 08/09/2021 4.04 (L)  4.26 - 5.60 10*12/L Final   ??? HGB 08/09/2021 11.3 (L)  12.9 - 16.5 g/dL Final   ??? HCT 10/93/2355 35.3 (L)  39.0 - 48.0 % Final   ???  MCV 08/09/2021 87.5  77.6 - 95.7 fL Final   ??? MCH 08/09/2021 28.1  25.9 - 32.4 pg Final   ??? MCHC 08/09/2021 32.1  32.0 - 36.0 g/dL Final   ??? RDW 16/05/9603 15.1  12.2 - 15.2 % Final   ??? MPV 08/09/2021 9.2  6.8 - 10.7 fL Final   ??? Platelet 08/09/2021 262  150 - 450 10*9/L Final   ??? Neutrophils % 08/09/2021 72.0  % Final   ??? Lymphocytes % 08/09/2021 16.8  % Final   ??? Monocytes % 08/09/2021 6.4  % Final   ??? Eosinophils % 08/09/2021 3.9  % Final   ??? Basophils % 08/09/2021 0.9  % Final   ??? Absolute Neutrophils 08/09/2021 3.5  1.8 - 7.8 10*9/L Final   ??? Absolute Lymphocytes 08/09/2021 0.8 (L)  1.1 - 3.6 10*9/L Final   ??? Absolute Monocytes 08/09/2021 0.3  0.3 - 0.8 10*9/L Final   ??? Absolute Eosinophils 08/09/2021 0.2  0.0 - 0.5 10*9/L Final   ??? Absolute Basophils 08/09/2021 0.0  0.0 - 0.1 10*9/L Final       Lab Results   Component Value Date    PSA 1.63 08/09/2021    PSA 2.81 05/05/2021    PSA 2.70 01/13/2021    PSA 3.88 10/12/2020    PSA 3.37 06/29/2020    PSA 2.83 03/18/2020    PSA 3.83 01/12/2020    PSA 4.14 (H) 12/25/2019    PSA 5.64 (H) 11/11/2019    PSA 8.13 (H) 07/14/2019             Orders placed or performed in visit on 08/09/21   ??? Clinic Appointment Request Physician, Lab, Injection     Molecular Pathology  Germline testing [facility] prostate panel 11/11/2019  Negative for pathogenic variants    Pathology  No prostate path available      Imaging results:  Bone scan 10/28/2018     Diffuse foci of uptake in the appendicular and axial skeleton as above, concerning for extensive osseous metastatic disease.    CT CAP 10/17/2018  3.9 cm partially calcified left thyroid mass. Further evaluation with dedicated ultrasound is recommended.  ??  0.2 cm right upper lobe subpleural nodule ; indeterminate. Recommend follow up CT in 3 months for further assessment.  ??  Expansile soft tissue metastatic lesion involving the right anterolateral fourth rib.  Diffusely sclerotic/lytic appearance of the axial and proximal appendicular skeleton is concerning for extensive osseous metastatic disease.    Diffuse osseous metastatic disease with erosive deformities most significantly involving the T10 and L3 vertebral bodies. Significant extension of metastases into the epidural space with narrowing of the cord at the level of the L3 vertebral body. This was discussed with the neurosurgery ICU at 11:00 AM on 10/17/2018.  ??  Multifocal hepatic metastases measuring up to 3.8 cm within segment 3.  ??  Indeterminant 1.4 cm upper left renal pole hypodensity. This may be further evaluated with multiphasic CT renal mass protocol or MRI if clinically desired. Alternatively, attention at follow-up is recommended.    Bone scan 01/11/2021  Multiple foci of radiotracer uptake throughout the axial skeleton are overall less prominent compared to prior study    CT CAP 01/11/2021  Stable scattered micronodules in bilateral lung parenchyma  1. Slight interval increase in hypodense hepatic lesions which remain indeterminate, although have not significantly changed in size compared to 2020. Liver biopsy in 2020 was negative for tumor. Follow-up evaluation with MRI is recommended to further  characterize these lesions which remain indeterminate for metastasis.  ??  2. Osseous metastases, some of which appear slightly more sclerotic than prior.

## 2021-08-09 NOTE — Unmapped (Signed)
Patient has questions r/t Calcium and D-3 medications. Encouraged to ask Dr Philomena Course.

## 2021-08-18 NOTE — Unmapped (Signed)
[facility] Specialty Medication Onboarding    Specialty Medication: [name]  Prior Authorization: Approved   Financial Assistance: Yes - grant approved as secondary   Final Copay/Day Supply: $0 / 30    Insurance Restrictions: None     Notes to Pharmacist:     The triage team has completed the benefits investigation and has determined that the patient is able to fill this medication at [facility]. Please contact the patient to complete the onboarding or follow up with the prescribing physician as needed.

## 2021-08-18 NOTE — Unmapped (Signed)
Specialty Medication(s): Jeffery Tyler    Jeffery Tyler has been dis-enrolled from the [facility] specialty pharmacy services due to a pharmacy change. The patient is now filling at [facility] pharmacy .    Additional information provided to the patient: NA    Jeffery Tyler [facility] Specialty Pharmacist

## 2021-10-04 MED ORDER — ATORVASTATIN 10 MG TABLET
ORAL_TABLET | Freq: Every evening | ORAL | 1 refills | 90 days | Status: CP
Start: 2021-10-04 — End: 2022-10-05

## 2021-11-08 ENCOUNTER — Other Ambulatory Visit: Admit: 2021-11-08 | Discharge: 2021-11-09 | Payer: MEDICARE

## 2021-11-08 ENCOUNTER — Ambulatory Visit
Admit: 2021-11-08 | Discharge: 2021-11-09 | Payer: MEDICARE | Attending: Hematology & Oncology | Primary: Hematology & Oncology

## 2021-11-08 DIAGNOSIS — C61 Malignant neoplasm of prostate: Principal | ICD-10-CM

## 2021-11-08 DIAGNOSIS — C7951 Secondary malignant neoplasm of bone: Principal | ICD-10-CM

## 2021-11-28 ENCOUNTER — Ambulatory Visit: Admit: 2021-11-28 | Discharge: 2021-11-29 | Payer: MEDICARE

## 2021-11-28 DIAGNOSIS — C7989 Secondary malignant neoplasm of other specified sites: Principal | ICD-10-CM

## 2022-01-27 ENCOUNTER — Ambulatory Visit: Admit: 2022-01-27 | Discharge: 2022-01-28 | Payer: MEDICARE

## 2022-01-29 MED ORDER — PREDNISONE 20 MG TABLET
ORAL_TABLET | ORAL | 0 refills | 7 days | Status: CP
Start: 2022-01-29 — End: 2022-02-05
  Filled 2022-01-28: qty 11, 7d supply, fill #0

## 2022-02-05 DIAGNOSIS — C61 Malignant neoplasm of prostate: Principal | ICD-10-CM

## 2022-02-05 DIAGNOSIS — C7951 Secondary malignant neoplasm of bone: Principal | ICD-10-CM

## 2022-02-06 ENCOUNTER — Ambulatory Visit: Admit: 2022-02-06 | Discharge: 2022-02-07 | Payer: MEDICARE

## 2022-02-06 DIAGNOSIS — M79672 Pain in left foot: Principal | ICD-10-CM

## 2022-02-06 DIAGNOSIS — Z09 Encounter for follow-up examination after completed treatment for conditions other than malignant neoplasm: Principal | ICD-10-CM

## 2022-02-06 DIAGNOSIS — M109 Gout, unspecified: Principal | ICD-10-CM

## 2022-02-06 DIAGNOSIS — M79671 Pain in right foot: Principal | ICD-10-CM

## 2022-02-06 MED ORDER — COLCHICINE 0.6 MG TABLET
ORAL_TABLET | Freq: Every day | ORAL | 2 refills | 30 days | Status: CP
Start: 2022-02-06 — End: 2023-02-06

## 2022-02-06 MED ORDER — INDOMETHACIN 50 MG CAPSULE
ORAL_CAPSULE | 2 refills | 0 days | Status: CP
Start: 2022-02-06 — End: ?

## 2022-02-09 ENCOUNTER — Ambulatory Visit: Admit: 2022-02-09 | Discharge: 2022-02-11 | Payer: MEDICARE

## 2022-02-09 ENCOUNTER — Ambulatory Visit
Admit: 2022-02-09 | Discharge: 2022-02-10 | Payer: MEDICARE | Attending: Hematology & Oncology | Primary: Hematology & Oncology

## 2022-02-09 ENCOUNTER — Other Ambulatory Visit: Admit: 2022-02-09 | Discharge: 2022-02-10 | Payer: MEDICARE

## 2022-02-09 ENCOUNTER — Ambulatory Visit: Admit: 2022-02-09 | Discharge: 2022-02-10 | Payer: MEDICARE

## 2022-02-09 ENCOUNTER — Institutional Professional Consult (permissible substitution): Admit: 2022-02-09 | Discharge: 2022-02-10 | Payer: MEDICARE

## 2022-02-09 DIAGNOSIS — C61 Malignant neoplasm of prostate: Principal | ICD-10-CM

## 2022-02-09 DIAGNOSIS — C7951 Secondary malignant neoplasm of bone: Principal | ICD-10-CM

## 2022-03-31 MED ORDER — ATORVASTATIN 10 MG TABLET
ORAL_TABLET | Freq: Every evening | ORAL | 1 refills | 90 days | Status: CP
Start: 2022-03-31 — End: ?

## 2022-05-23 ENCOUNTER — Ambulatory Visit
Admit: 2022-05-23 | Discharge: 2022-05-24 | Payer: MEDICARE | Attending: Hematology & Oncology | Primary: Hematology & Oncology

## 2022-05-23 ENCOUNTER — Other Ambulatory Visit: Admit: 2022-05-23 | Discharge: 2022-05-24 | Payer: MEDICARE

## 2022-05-23 DIAGNOSIS — C61 Malignant neoplasm of prostate: Principal | ICD-10-CM

## 2022-05-23 DIAGNOSIS — C7951 Secondary malignant neoplasm of bone: Principal | ICD-10-CM

## 2022-05-24 MED ORDER — ERLEADA 60 MG TABLET
ORAL_TABLET | Freq: Every day | ORAL | 11 refills | 30 days | Status: CP
Start: 2022-05-24 — End: ?

## 2022-05-25 DIAGNOSIS — C61 Malignant neoplasm of prostate: Principal | ICD-10-CM

## 2022-06-01 ENCOUNTER — Ambulatory Visit: Admit: 2022-06-01 | Discharge: 2022-06-02 | Payer: MEDICARE

## 2022-06-01 DIAGNOSIS — R635 Abnormal weight gain: Principal | ICD-10-CM

## 2022-06-01 DIAGNOSIS — M1120 Other chondrocalcinosis, unspecified site: Principal | ICD-10-CM

## 2022-06-01 DIAGNOSIS — E669 Obesity, unspecified: Principal | ICD-10-CM

## 2022-06-01 DIAGNOSIS — M1990 Unspecified osteoarthritis, unspecified site: Principal | ICD-10-CM

## 2022-06-01 DIAGNOSIS — C61 Malignant neoplasm of prostate: Principal | ICD-10-CM

## 2022-06-01 DIAGNOSIS — E785 Hyperlipidemia, unspecified: Principal | ICD-10-CM

## 2022-06-01 DIAGNOSIS — Z8679 Personal history of other diseases of the circulatory system: Principal | ICD-10-CM

## 2022-07-12 DIAGNOSIS — C61 Malignant neoplasm of prostate: Principal | ICD-10-CM

## 2022-07-12 MED ORDER — ERLEADA 60 MG TABLET
ORAL_TABLET | Freq: Every day | ORAL | 11 refills | 30 days | Status: CP
Start: 2022-07-12 — End: ?

## 2022-07-17 ENCOUNTER — Ambulatory Visit
Admit: 2022-07-17 | Discharge: 2022-07-18 | Payer: MEDICARE | Attending: Student in an Organized Health Care Education/Training Program | Primary: Student in an Organized Health Care Education/Training Program

## 2022-07-17 DIAGNOSIS — M1990 Unspecified osteoarthritis, unspecified site: Principal | ICD-10-CM

## 2022-07-17 DIAGNOSIS — M109 Gout, unspecified: Principal | ICD-10-CM

## 2022-07-17 MED ORDER — DICLOFENAC 1 % TOPICAL GEL
Freq: Four times a day (QID) | TOPICAL | 0 refills | 13 days | Status: CP
Start: 2022-07-17 — End: 2023-07-17

## 2022-07-27 DIAGNOSIS — M79671 Pain in right foot: Principal | ICD-10-CM

## 2022-07-27 DIAGNOSIS — M79672 Pain in left foot: Principal | ICD-10-CM

## 2022-07-27 DIAGNOSIS — M109 Gout, unspecified: Principal | ICD-10-CM

## 2022-08-08 DIAGNOSIS — C61 Malignant neoplasm of prostate: Principal | ICD-10-CM

## 2022-08-08 DIAGNOSIS — C7951 Secondary malignant neoplasm of bone: Principal | ICD-10-CM

## 2022-08-10 ENCOUNTER — Institutional Professional Consult (permissible substitution): Admit: 2022-08-10 | Discharge: 2022-08-10 | Payer: MEDICARE

## 2022-08-10 ENCOUNTER — Ambulatory Visit: Admit: 2022-08-10 | Discharge: 2022-08-10 | Payer: MEDICARE

## 2022-08-10 ENCOUNTER — Ambulatory Visit: Admit: 2022-08-10 | Discharge: 2022-08-12 | Payer: MEDICARE

## 2022-08-10 ENCOUNTER — Ambulatory Visit
Admit: 2022-08-10 | Discharge: 2022-08-10 | Payer: MEDICARE | Attending: Hematology & Oncology | Primary: Hematology & Oncology

## 2022-08-10 DIAGNOSIS — C7951 Secondary malignant neoplasm of bone: Principal | ICD-10-CM

## 2022-08-10 DIAGNOSIS — C61 Malignant neoplasm of prostate: Principal | ICD-10-CM

## 2022-08-10 DIAGNOSIS — M79672 Pain in left foot: Principal | ICD-10-CM

## 2022-08-10 DIAGNOSIS — C7949 Secondary malignant neoplasm of other parts of nervous system: Principal | ICD-10-CM

## 2022-08-10 DIAGNOSIS — M109 Gout, unspecified: Principal | ICD-10-CM

## 2022-08-10 DIAGNOSIS — M79671 Pain in right foot: Principal | ICD-10-CM

## 2022-08-11 DIAGNOSIS — M109 Gout, unspecified: Principal | ICD-10-CM

## 2022-08-11 MED ORDER — ALLOPURINOL 100 MG TABLET
ORAL_TABLET | Freq: Every day | ORAL | 11 refills | 30 days | Status: CP
Start: 2022-08-11 — End: 2023-08-11

## 2022-09-13 DIAGNOSIS — M109 Gout, unspecified: Principal | ICD-10-CM

## 2022-10-02 ENCOUNTER — Ambulatory Visit: Admit: 2022-10-02 | Discharge: 2022-10-03 | Payer: MEDICARE

## 2022-10-02 DIAGNOSIS — M1120 Other chondrocalcinosis, unspecified site: Principal | ICD-10-CM

## 2022-10-02 DIAGNOSIS — M1990 Unspecified osteoarthritis, unspecified site: Principal | ICD-10-CM

## 2022-10-02 DIAGNOSIS — C7951 Secondary malignant neoplasm of bone: Principal | ICD-10-CM

## 2022-10-02 DIAGNOSIS — E785 Hyperlipidemia, unspecified: Principal | ICD-10-CM

## 2022-10-02 DIAGNOSIS — C61 Malignant neoplasm of prostate: Principal | ICD-10-CM

## 2022-10-02 DIAGNOSIS — E669 Obesity, unspecified: Principal | ICD-10-CM

## 2022-10-16 ENCOUNTER — Ambulatory Visit
Admit: 2022-10-16 | Discharge: 2022-10-17 | Payer: MEDICARE | Attending: Student in an Organized Health Care Education/Training Program | Primary: Student in an Organized Health Care Education/Training Program

## 2022-10-16 DIAGNOSIS — L602 Onychogryphosis: Principal | ICD-10-CM

## 2022-10-16 DIAGNOSIS — M109 Gout, unspecified: Principal | ICD-10-CM

## 2022-10-16 MED ORDER — MELOXICAM 7.5 MG TABLET
ORAL_TABLET | Freq: Every day | ORAL | 2 refills | 30 days | Status: CP | PRN
Start: 2022-10-16 — End: 2023-10-16

## 2022-10-17 MED ORDER — MELOXICAM 7.5 MG TABLET
ORAL_TABLET | Freq: Every day | ORAL | 2 refills | 30 days | Status: CP | PRN
Start: 2022-10-17 — End: 2023-10-17

## 2022-10-18 DIAGNOSIS — M109 Gout, unspecified: Principal | ICD-10-CM

## 2022-10-18 DIAGNOSIS — L602 Onychogryphosis: Principal | ICD-10-CM

## 2022-11-10 DIAGNOSIS — M109 Gout, unspecified: Principal | ICD-10-CM

## 2022-11-10 MED ORDER — INDOMETHACIN 50 MG CAPSULE
ORAL_CAPSULE | 2 refills | 0 days
Start: 2022-11-10 — End: ?

## 2022-11-10 MED ORDER — COLCHICINE 0.6 MG TABLET
ORAL_TABLET | 2 refills | 0 days
Start: 2022-11-10 — End: ?

## 2022-11-10 MED ORDER — ATORVASTATIN 10 MG TABLET
ORAL_TABLET | Freq: Every evening | ORAL | 1 refills | 90 days | Status: CP
Start: 2022-11-10 — End: 2023-11-10

## 2022-11-13 DIAGNOSIS — M109 Gout, unspecified: Principal | ICD-10-CM

## 2022-11-13 MED ORDER — COLCHICINE 0.6 MG TABLET
0 days
Start: 2022-11-13 — End: ?

## 2022-11-14 ENCOUNTER — Ambulatory Visit
Admit: 2022-11-14 | Discharge: 2022-11-15 | Payer: MEDICARE | Attending: Hematology & Oncology | Primary: Hematology & Oncology

## 2022-11-14 ENCOUNTER — Other Ambulatory Visit: Admit: 2022-11-14 | Discharge: 2022-11-15 | Payer: MEDICARE

## 2022-11-14 DIAGNOSIS — C7951 Secondary malignant neoplasm of bone: Principal | ICD-10-CM

## 2022-11-14 DIAGNOSIS — C61 Malignant neoplasm of prostate: Principal | ICD-10-CM

## 2022-11-14 MED ORDER — COLCHICINE 0.6 MG TABLET
0 days
Start: 2022-11-14 — End: ?

## 2023-02-04 DIAGNOSIS — C61 Malignant neoplasm of prostate: Principal | ICD-10-CM

## 2023-02-04 DIAGNOSIS — C7951 Secondary malignant neoplasm of bone: Principal | ICD-10-CM

## 2023-02-13 ENCOUNTER — Ambulatory Visit
Admit: 2023-02-13 | Discharge: 2023-02-13 | Payer: MEDICARE | Attending: Hematology & Oncology | Primary: Hematology & Oncology

## 2023-02-13 ENCOUNTER — Institutional Professional Consult (permissible substitution): Admit: 2023-02-13 | Discharge: 2023-02-13 | Payer: MEDICARE

## 2023-02-13 ENCOUNTER — Other Ambulatory Visit: Admit: 2023-02-13 | Discharge: 2023-02-13 | Payer: MEDICARE

## 2023-02-13 DIAGNOSIS — Z8546 Personal history of malignant neoplasm of prostate: Principal | ICD-10-CM

## 2023-02-13 DIAGNOSIS — C61 Malignant neoplasm of prostate: Principal | ICD-10-CM

## 2023-02-13 DIAGNOSIS — C7951 Secondary malignant neoplasm of bone: Principal | ICD-10-CM

## 2023-02-13 DIAGNOSIS — Z79899 Other long term (current) drug therapy: Principal | ICD-10-CM

## 2023-03-09 MED ORDER — MELOXICAM 7.5 MG TABLET
ORAL_TABLET | Freq: Every day | ORAL | 2 refills | 30 days | Status: CP | PRN
Start: 2023-03-09 — End: 2024-03-08

## 2023-04-05 ENCOUNTER — Ambulatory Visit
Admit: 2023-04-05 | Discharge: 2023-04-06 | Payer: MEDICARE | Attending: Student in an Organized Health Care Education/Training Program | Primary: Student in an Organized Health Care Education/Training Program

## 2023-04-05 DIAGNOSIS — Z23 Encounter for immunization: Principal | ICD-10-CM

## 2023-04-05 DIAGNOSIS — C61 Malignant neoplasm of prostate: Principal | ICD-10-CM

## 2023-04-05 DIAGNOSIS — M1990 Unspecified osteoarthritis, unspecified site: Principal | ICD-10-CM

## 2023-04-05 DIAGNOSIS — I10 Essential (primary) hypertension: Principal | ICD-10-CM

## 2023-04-05 DIAGNOSIS — D649 Anemia, unspecified: Principal | ICD-10-CM

## 2023-04-05 DIAGNOSIS — M1120 Other chondrocalcinosis, unspecified site: Principal | ICD-10-CM

## 2023-04-05 DIAGNOSIS — G9389 Other specified disorders of brain: Principal | ICD-10-CM

## 2023-04-05 DIAGNOSIS — C7951 Secondary malignant neoplasm of bone: Principal | ICD-10-CM

## 2023-04-05 DIAGNOSIS — E785 Hyperlipidemia, unspecified: Principal | ICD-10-CM

## 2023-04-05 MED ORDER — MELOXICAM 7.5 MG TABLET
ORAL_TABLET | Freq: Every day | ORAL | 2 refills | 30 days | Status: CP | PRN
Start: 2023-04-05 — End: 2024-04-04

## 2023-05-05 MED ORDER — ATORVASTATIN 10 MG TABLET
ORAL_TABLET | Freq: Every evening | ORAL | 1 refills | 0 days
Start: 2023-05-05 — End: ?

## 2023-05-07 MED ORDER — ATORVASTATIN 10 MG TABLET
ORAL_TABLET | ORAL | 1 refills | 90 days | Status: CP
Start: 2023-05-07 — End: 2024-05-06

## 2023-05-14 DIAGNOSIS — M109 Gout, unspecified: Principal | ICD-10-CM

## 2023-05-14 MED ORDER — INDOMETHACIN 50 MG CAPSULE
ORAL_CAPSULE | 2 refills | 0 days | Status: CP
Start: 2023-05-14 — End: ?

## 2023-05-17 ENCOUNTER — Ambulatory Visit
Admit: 2023-05-17 | Discharge: 2023-05-18 | Payer: MEDICARE | Attending: Hematology & Oncology | Primary: Hematology & Oncology

## 2023-05-17 ENCOUNTER — Other Ambulatory Visit: Admit: 2023-05-17 | Discharge: 2023-05-18 | Payer: MEDICARE

## 2023-05-17 DIAGNOSIS — C7951 Secondary malignant neoplasm of bone: Principal | ICD-10-CM

## 2023-05-17 DIAGNOSIS — C61 Malignant neoplasm of prostate: Principal | ICD-10-CM

## 2023-06-06 DIAGNOSIS — C61 Malignant neoplasm of prostate: Principal | ICD-10-CM

## 2023-06-06 MED ORDER — ERLEADA 60 MG TABLET
ORAL_TABLET | Freq: Every day | ORAL | 11 refills | 30 days | Status: CP
Start: 2023-06-06 — End: ?

## 2023-06-28 DIAGNOSIS — C61 Malignant neoplasm of prostate: Principal | ICD-10-CM

## 2023-07-12 ENCOUNTER — Ambulatory Visit
Admit: 2023-07-12 | Discharge: 2023-07-13 | Payer: MEDICARE | Attending: Student in an Organized Health Care Education/Training Program | Primary: Student in an Organized Health Care Education/Training Program

## 2023-07-12 DIAGNOSIS — M109 Gout, unspecified: Principal | ICD-10-CM

## 2023-07-12 DIAGNOSIS — E785 Hyperlipidemia, unspecified: Principal | ICD-10-CM

## 2023-07-12 DIAGNOSIS — C61 Malignant neoplasm of prostate: Principal | ICD-10-CM

## 2023-07-12 DIAGNOSIS — Z0000 Encounter for general adult medical examination without abnormal findings: Principal | ICD-10-CM

## 2023-07-12 DIAGNOSIS — C7951 Secondary malignant neoplasm of bone: Principal | ICD-10-CM

## 2023-07-12 MED ORDER — ATORVASTATIN 10 MG TABLET
ORAL_TABLET | Freq: Every evening | ORAL | 1 refills | 90 days | Status: CP
Start: 2023-07-12 — End: 2024-07-11

## 2023-07-12 MED ORDER — ALLOPURINOL 100 MG TABLET
ORAL_TABLET | Freq: Every day | ORAL | 5 refills | 30.00 days | Status: CP
Start: 2023-07-12 — End: 2024-01-08

## 2023-08-05 DIAGNOSIS — M1990 Unspecified osteoarthritis, unspecified site: Principal | ICD-10-CM

## 2023-08-05 MED ORDER — MELOXICAM 7.5 MG TABLET
ORAL_TABLET | Freq: Every day | ORAL | 2 refills | 30.00 days | Status: CP | PRN
Start: 2023-08-05 — End: 2024-08-04

## 2023-08-23 ENCOUNTER — Inpatient Hospital Stay: Admit: 2023-08-23 | Discharge: 2023-08-25 | Payer: MEDICARE

## 2023-08-23 ENCOUNTER — Inpatient Hospital Stay: Admit: 2023-08-23 | Discharge: 2023-08-24 | Payer: MEDICARE

## 2023-08-23 ENCOUNTER — Ambulatory Visit: Admit: 2023-08-23 | Discharge: 2023-08-24 | Payer: MEDICARE

## 2023-08-23 DIAGNOSIS — C61 Malignant neoplasm of prostate: Principal | ICD-10-CM

## 2023-08-23 DIAGNOSIS — C7951 Secondary malignant neoplasm of bone: Principal | ICD-10-CM

## 2023-08-30 ENCOUNTER — Inpatient Hospital Stay: Admit: 2023-08-30 | Discharge: 2023-08-30 | Payer: MEDICARE

## 2023-08-30 ENCOUNTER — Ambulatory Visit
Admit: 2023-08-30 | Discharge: 2023-08-30 | Payer: MEDICARE | Attending: Hematology & Oncology | Primary: Hematology & Oncology

## 2023-08-30 ENCOUNTER — Other Ambulatory Visit: Admit: 2023-08-30 | Discharge: 2023-08-30 | Payer: MEDICARE

## 2023-08-30 DIAGNOSIS — C61 Malignant neoplasm of prostate: Principal | ICD-10-CM

## 2023-08-30 DIAGNOSIS — C7951 Secondary malignant neoplasm of bone: Principal | ICD-10-CM

## 2023-08-30 MED ORDER — ERLEADA 60 MG TABLET
ORAL_TABLET | Freq: Every day | ORAL | 11 refills | 30.00 days | Status: CP
Start: 2023-08-30 — End: ?

## 2023-10-21 DIAGNOSIS — M109 Gout, unspecified: Principal | ICD-10-CM

## 2023-10-21 MED ORDER — ALLOPURINOL 100 MG TABLET
ORAL_TABLET | Freq: Every day | ORAL | 1 refills | 0 days
Start: 2023-10-21 — End: ?

## 2023-10-22 MED ORDER — ALLOPURINOL 100 MG TABLET
ORAL_TABLET | Freq: Every day | ORAL | 1 refills | 90 days | Status: CP
Start: 2023-10-22 — End: ?

## 2023-11-12 ENCOUNTER — Ambulatory Visit
Admit: 2023-11-12 | Discharge: 2023-11-13 | Payer: MEDICARE | Attending: Student in an Organized Health Care Education/Training Program | Primary: Student in an Organized Health Care Education/Training Program

## 2023-11-12 DIAGNOSIS — D649 Anemia, unspecified: Principal | ICD-10-CM

## 2023-11-12 DIAGNOSIS — Z5320 Procedure and treatment not carried out because of patient's decision for unspecified reasons: Principal | ICD-10-CM

## 2023-11-12 DIAGNOSIS — E785 Hyperlipidemia, unspecified: Principal | ICD-10-CM

## 2023-11-12 DIAGNOSIS — M1990 Unspecified osteoarthritis, unspecified site: Principal | ICD-10-CM

## 2023-11-12 DIAGNOSIS — R7303 Prediabetes: Principal | ICD-10-CM

## 2023-11-12 DIAGNOSIS — N182 Chronic kidney disease, stage 2 (mild): Principal | ICD-10-CM

## 2023-11-12 DIAGNOSIS — Z2821 Immunization not carried out because of patient refusal: Principal | ICD-10-CM

## 2023-11-12 DIAGNOSIS — E559 Vitamin D deficiency, unspecified: Principal | ICD-10-CM

## 2023-11-12 DIAGNOSIS — C61 Malignant neoplasm of prostate: Principal | ICD-10-CM

## 2023-11-12 DIAGNOSIS — I10 Essential (primary) hypertension: Principal | ICD-10-CM

## 2023-11-12 DIAGNOSIS — M1120 Other chondrocalcinosis, unspecified site: Principal | ICD-10-CM

## 2023-11-12 DIAGNOSIS — C7951 Secondary malignant neoplasm of bone: Principal | ICD-10-CM

## 2023-12-26 DIAGNOSIS — M1990 Unspecified osteoarthritis, unspecified site: Principal | ICD-10-CM

## 2023-12-26 MED ORDER — MELOXICAM 7.5 MG TABLET
ORAL_TABLET | Freq: Every day | ORAL | 2 refills | 30.00000 days | Status: CP | PRN
Start: 2023-12-26 — End: 2024-12-25

## 2024-01-01 ENCOUNTER — Other Ambulatory Visit: Admit: 2024-01-01 | Discharge: 2024-01-02 | Payer: MEDICARE

## 2024-01-01 ENCOUNTER — Ambulatory Visit
Admit: 2024-01-01 | Discharge: 2024-01-02 | Payer: MEDICARE | Attending: Hematology & Oncology | Primary: Hematology & Oncology

## 2024-01-01 DIAGNOSIS — C61 Malignant neoplasm of prostate: Principal | ICD-10-CM

## 2024-01-01 DIAGNOSIS — C7951 Secondary malignant neoplasm of bone: Principal | ICD-10-CM

## 2024-01-01 DIAGNOSIS — T50905A Adverse effect of unspecified drugs, medicaments and biological substances, initial encounter: Principal | ICD-10-CM

## 2024-01-25 ENCOUNTER — Inpatient Hospital Stay: Admit: 2024-01-25 | Discharge: 2024-01-25 | Payer: MEDICARE

## 2024-02-04 DIAGNOSIS — C61 Malignant neoplasm of prostate: Principal | ICD-10-CM

## 2024-02-04 DIAGNOSIS — C7951 Secondary malignant neoplasm of bone: Principal | ICD-10-CM

## 2024-02-21 ENCOUNTER — Ambulatory Visit: Admit: 2024-02-21 | Discharge: 2024-02-21 | Payer: MEDICARE

## 2024-02-21 ENCOUNTER — Ambulatory Visit
Admit: 2024-02-21 | Discharge: 2024-02-21 | Payer: MEDICARE | Attending: Hematology & Oncology | Primary: Hematology & Oncology

## 2024-02-21 ENCOUNTER — Other Ambulatory Visit: Admit: 2024-02-21 | Discharge: 2024-02-21 | Payer: MEDICARE

## 2024-02-21 DIAGNOSIS — C61 Malignant neoplasm of prostate: Principal | ICD-10-CM

## 2024-02-21 DIAGNOSIS — C7951 Secondary malignant neoplasm of bone: Principal | ICD-10-CM

## 2024-02-24 DIAGNOSIS — E785 Hyperlipidemia, unspecified: Principal | ICD-10-CM

## 2024-02-24 MED ORDER — ATORVASTATIN 10 MG TABLET
ORAL_TABLET | Freq: Every evening | ORAL | 1 refills | 0.00000 days
Start: 2024-02-24 — End: ?

## 2024-02-25 MED ORDER — ATORVASTATIN 10 MG TABLET
ORAL_TABLET | Freq: Every evening | ORAL | 1 refills | 90.00000 days | Status: CP
Start: 2024-02-25 — End: ?

## 2024-03-11 DIAGNOSIS — C61 Malignant neoplasm of prostate: Principal | ICD-10-CM

## 2024-03-11 DIAGNOSIS — C7951 Secondary malignant neoplasm of bone: Principal | ICD-10-CM

## 2024-03-17 DIAGNOSIS — R7303 Prediabetes: Principal | ICD-10-CM

## 2024-03-17 DIAGNOSIS — C7951 Secondary malignant neoplasm of bone: Principal | ICD-10-CM

## 2024-03-17 DIAGNOSIS — I10 Essential (primary) hypertension: Principal | ICD-10-CM

## 2024-03-17 DIAGNOSIS — C61 Malignant neoplasm of prostate: Principal | ICD-10-CM

## 2024-03-17 DIAGNOSIS — M1120 Other chondrocalcinosis, unspecified site: Principal | ICD-10-CM

## 2024-03-20 ENCOUNTER — Inpatient Hospital Stay: Admit: 2024-03-20 | Discharge: 2024-03-20 | Payer: MEDICARE

## 2024-03-20 ENCOUNTER — Other Ambulatory Visit: Admit: 2024-03-20 | Discharge: 2024-03-20 | Payer: MEDICARE

## 2024-03-20 ENCOUNTER — Ambulatory Visit: Admit: 2024-03-20 | Discharge: 2024-03-20 | Payer: MEDICARE

## 2024-03-20 DIAGNOSIS — E785 Hyperlipidemia, unspecified: Principal | ICD-10-CM

## 2024-03-20 DIAGNOSIS — R7303 Prediabetes: Principal | ICD-10-CM

## 2024-03-20 DIAGNOSIS — C61 Malignant neoplasm of prostate: Principal | ICD-10-CM

## 2024-03-20 DIAGNOSIS — C7951 Secondary malignant neoplasm of bone: Principal | ICD-10-CM

## 2024-03-20 DIAGNOSIS — I10 Essential (primary) hypertension: Principal | ICD-10-CM

## 2024-03-20 DIAGNOSIS — D649 Anemia, unspecified: Principal | ICD-10-CM

## 2024-03-20 DIAGNOSIS — E559 Vitamin D deficiency, unspecified: Principal | ICD-10-CM

## 2024-03-20 MED ORDER — ATORVASTATIN 20 MG TABLET
ORAL_TABLET | Freq: Every day | ORAL | 3 refills | 90.00000 days | Status: CP
Start: 2024-03-20 — End: 2025-03-20

## 2024-03-24 DIAGNOSIS — C7951 Secondary malignant neoplasm of bone: Principal | ICD-10-CM

## 2024-03-24 DIAGNOSIS — C61 Malignant neoplasm of prostate: Principal | ICD-10-CM

## 2024-04-08 ENCOUNTER — Other Ambulatory Visit: Admit: 2024-04-08 | Discharge: 2024-04-08 | Payer: MEDICARE

## 2024-04-08 ENCOUNTER — Inpatient Hospital Stay: Admit: 2024-04-08 | Discharge: 2024-04-08 | Payer: MEDICARE

## 2024-04-08 ENCOUNTER — Ambulatory Visit: Admit: 2024-04-08 | Discharge: 2024-04-08 | Payer: MEDICARE

## 2024-04-08 DIAGNOSIS — C7951 Secondary malignant neoplasm of bone: Principal | ICD-10-CM

## 2024-04-08 DIAGNOSIS — C61 Malignant neoplasm of prostate: Principal | ICD-10-CM

## 2024-05-01 DIAGNOSIS — C7951 Secondary malignant neoplasm of bone: Principal | ICD-10-CM

## 2024-05-01 DIAGNOSIS — C61 Malignant neoplasm of prostate: Principal | ICD-10-CM

## 2024-05-07 DIAGNOSIS — M1990 Unspecified osteoarthritis, unspecified site: Principal | ICD-10-CM

## 2024-05-07 MED ORDER — MELOXICAM 7.5 MG TABLET
ORAL_TABLET | Freq: Every day | ORAL | 2 refills | 30.00000 days | Status: CP | PRN
Start: 2024-05-07 — End: 2025-05-07

## 2024-05-20 ENCOUNTER — Ambulatory Visit: Admit: 2024-05-20 | Discharge: 2024-05-20 | Payer: MEDICARE

## 2024-05-20 ENCOUNTER — Other Ambulatory Visit: Admit: 2024-05-20 | Discharge: 2024-05-20 | Payer: MEDICARE

## 2024-05-20 ENCOUNTER — Inpatient Hospital Stay: Admit: 2024-05-20 | Discharge: 2024-05-20 | Payer: MEDICARE

## 2024-05-20 DIAGNOSIS — C61 Malignant neoplasm of prostate: Principal | ICD-10-CM

## 2024-05-20 DIAGNOSIS — C7951 Secondary malignant neoplasm of bone: Principal | ICD-10-CM

## 2024-05-27 ENCOUNTER — Inpatient Hospital Stay: Admit: 2024-05-27 | Discharge: 2024-05-28 | Payer: MEDICARE

## 2024-05-27 ENCOUNTER — Other Ambulatory Visit: Admit: 2024-05-27 | Discharge: 2024-05-28 | Payer: MEDICARE

## 2024-05-27 DIAGNOSIS — C7951 Secondary malignant neoplasm of bone: Principal | ICD-10-CM

## 2024-05-27 DIAGNOSIS — C61 Malignant neoplasm of prostate: Principal | ICD-10-CM

## 2024-06-12 DIAGNOSIS — C61 Malignant neoplasm of prostate: Principal | ICD-10-CM

## 2024-06-12 DIAGNOSIS — C7951 Secondary malignant neoplasm of bone: Principal | ICD-10-CM

## 2024-06-23 DIAGNOSIS — C61 Malignant neoplasm of prostate: Principal | ICD-10-CM

## 2024-06-23 DIAGNOSIS — C7951 Secondary malignant neoplasm of bone: Principal | ICD-10-CM

## 2024-07-01 ENCOUNTER — Ambulatory Visit: Admit: 2024-07-01 | Discharge: 2024-07-01 | Payer: MEDICARE

## 2024-07-01 ENCOUNTER — Inpatient Hospital Stay: Admit: 2024-07-01 | Discharge: 2024-07-01 | Payer: MEDICARE

## 2024-07-01 ENCOUNTER — Ambulatory Visit
Admit: 2024-07-01 | Discharge: 2024-07-01 | Payer: MEDICARE | Attending: Hematology & Oncology | Primary: Hematology & Oncology

## 2024-07-01 ENCOUNTER — Other Ambulatory Visit: Admit: 2024-07-01 | Discharge: 2024-07-01 | Payer: MEDICARE

## 2024-07-01 DIAGNOSIS — C7951 Secondary malignant neoplasm of bone: Principal | ICD-10-CM

## 2024-07-01 DIAGNOSIS — C61 Malignant neoplasm of prostate: Principal | ICD-10-CM

## 2024-07-06 DIAGNOSIS — M109 Gout, unspecified: Principal | ICD-10-CM

## 2024-07-06 MED ORDER — ALLOPURINOL 100 MG TABLET
ORAL_TABLET | Freq: Every day | ORAL | 1 refills | 90.00000 days | Status: CP
Start: 2024-07-06 — End: ?

## 2024-07-07 ENCOUNTER — Inpatient Hospital Stay: Admit: 2024-07-07 | Discharge: 2024-07-07 | Payer: MEDICARE

## 2024-07-15 DIAGNOSIS — R7303 Prediabetes: Principal | ICD-10-CM

## 2024-07-15 DIAGNOSIS — Z0000 Encounter for general adult medical examination without abnormal findings: Principal | ICD-10-CM

## 2024-07-15 DIAGNOSIS — C7951 Secondary malignant neoplasm of bone: Principal | ICD-10-CM

## 2024-07-15 DIAGNOSIS — M1990 Unspecified osteoarthritis, unspecified site: Principal | ICD-10-CM

## 2024-07-15 DIAGNOSIS — E785 Hyperlipidemia, unspecified: Principal | ICD-10-CM

## 2024-07-15 DIAGNOSIS — C61 Malignant neoplasm of prostate: Principal | ICD-10-CM

## 2024-07-15 DIAGNOSIS — I10 Essential (primary) hypertension: Principal | ICD-10-CM

## 2024-07-15 DIAGNOSIS — M109 Gout, unspecified: Principal | ICD-10-CM

## 2024-07-15 MED ORDER — AMLODIPINE 2.5 MG TABLET
ORAL_TABLET | ORAL | 1 refills | 0.00000 days | Status: CP
Start: 2024-07-15 — End: ?

## 2024-07-18 ENCOUNTER — Inpatient Hospital Stay: Admit: 2024-07-18 | Discharge: 2024-07-18 | Payer: MEDICARE

## 2024-08-19 ENCOUNTER — Inpatient Hospital Stay: Admit: 2024-08-19 | Discharge: 2024-08-19 | Payer: BLUE CROSS/BLUE SHIELD

## 2024-08-19 ENCOUNTER — Ambulatory Visit: Admit: 2024-08-19 | Discharge: 2024-08-19 | Payer: BLUE CROSS/BLUE SHIELD

## 2024-08-19 ENCOUNTER — Other Ambulatory Visit: Admit: 2024-08-19 | Discharge: 2024-08-19 | Payer: MEDICARE

## 2024-08-19 DIAGNOSIS — C61 Malignant neoplasm of prostate: Principal | ICD-10-CM

## 2024-08-19 DIAGNOSIS — C7951 Secondary malignant neoplasm of bone: Principal | ICD-10-CM
